# Patient Record
Sex: Female | Born: 2000 | Race: White | Hispanic: No | Marital: Single | Smoking: Current every day smoker
Health system: Southern US, Community
[De-identification: ages and names within clinical notes are randomized; demographics above are authoritative.]

## PROBLEM LIST (undated history)

## (undated) ENCOUNTER — Inpatient Hospital Stay (HOSPITAL_COMMUNITY): Payer: Self-pay

## (undated) DIAGNOSIS — R51 Headache: Secondary | ICD-10-CM

## (undated) DIAGNOSIS — K859 Acute pancreatitis without necrosis or infection, unspecified: Secondary | ICD-10-CM

## (undated) DIAGNOSIS — F419 Anxiety disorder, unspecified: Secondary | ICD-10-CM

## (undated) DIAGNOSIS — K8020 Calculus of gallbladder without cholecystitis without obstruction: Secondary | ICD-10-CM

## (undated) DIAGNOSIS — F909 Attention-deficit hyperactivity disorder, unspecified type: Secondary | ICD-10-CM

## (undated) DIAGNOSIS — K219 Gastro-esophageal reflux disease without esophagitis: Secondary | ICD-10-CM

## (undated) DIAGNOSIS — F509 Eating disorder, unspecified: Secondary | ICD-10-CM

## (undated) DIAGNOSIS — J45909 Unspecified asthma, uncomplicated: Secondary | ICD-10-CM

## (undated) HISTORY — DX: Acute pancreatitis without necrosis or infection, unspecified: K85.90

## (undated) HISTORY — DX: Calculus of gallbladder without cholecystitis without obstruction: K80.20

## (undated) HISTORY — PX: WISDOM TOOTH EXTRACTION: SHX21

---
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
## 2018-01-03 ENCOUNTER — Other Ambulatory Visit: Payer: Self-pay | Admitting: Family Medicine

## 2018-01-03 ENCOUNTER — Other Ambulatory Visit: Payer: Self-pay | Admitting: Obstetrics and Gynecology

## 2018-01-03 ENCOUNTER — Other Ambulatory Visit: Payer: Self-pay

## 2018-01-03 ENCOUNTER — Ambulatory Visit (INDEPENDENT_AMBULATORY_CARE_PROVIDER_SITE_OTHER): Payer: Medicaid Other | Admitting: Obstetrics and Gynecology

## 2018-01-03 ENCOUNTER — Telehealth (HOSPITAL_COMMUNITY): Payer: Self-pay | Admitting: *Deleted

## 2018-01-03 ENCOUNTER — Encounter: Payer: Self-pay | Admitting: Obstetrics and Gynecology

## 2018-01-03 VITALS — BP 121/72 | HR 93 | Wt 189.0 lb

## 2018-01-03 DIAGNOSIS — O480 Post-term pregnancy: Secondary | ICD-10-CM | POA: Diagnosis not present

## 2018-01-03 DIAGNOSIS — Z331 Pregnant state, incidental: Secondary | ICD-10-CM

## 2018-01-03 DIAGNOSIS — Z1389 Encounter for screening for other disorder: Secondary | ICD-10-CM

## 2018-01-03 DIAGNOSIS — Z3A41 41 weeks gestation of pregnancy: Secondary | ICD-10-CM

## 2018-01-03 DIAGNOSIS — Z3403 Encounter for supervision of normal first pregnancy, third trimester: Secondary | ICD-10-CM

## 2018-01-03 LAB — POCT URINALYSIS DIPSTICK OB
Blood, UA: NEGATIVE
GLUCOSE, UA: NEGATIVE — AB
Ketones, UA: NEGATIVE
NITRITE UA: NEGATIVE
POC,PROTEIN,UA: NEGATIVE

## 2018-01-03 NOTE — Progress Notes (Signed)
Patient ID: Beth ErpVictoria Lore, female   DOB: 2000/10/20, 17 y.o.   MRN: [id number]    LOW-RISK PREGNANCY VISIT Patient name: Beth Key MRN [id number]  Date of birth: 2000/10/20 Chief Complaint:   Routine Prenatal Visit (NST-postdates)  History of Present Illness:   Beth Key is a 17 y.o. G1P0 female at 2058w0d with an Estimated Date of Delivery: 12/27/17 being seen today for ongoing management of a low-risk pregnancy.This is Imogine's first pregnancy Started seeing Beth Key at 25 weeks due to switching to family tree late in pregnancy. She  was feeling mild contractions yesterday that made her lose her breath.  Today she reports contractions since yesterday. Contractions: Irregular. Vag. Bleeding: None.  Movement: Present. denies leaking of fluid. Review of Systems:   Pertinent items are noted in HPI Denies abnormal vaginal discharge w/ itching/odor/irritation, headaches, visual changes, shortness of breath, chest pain, abdominal pain, severe nausea/vomiting, or problems with urination or bowel movements unless otherwise stated above. Pertinent History Reviewed:  Reviewed past medical,surgical, social, obstetrical and family history.  Reviewed problem list, medications and allergies. Physical Assessment:   Vitals:   01/03/18 1015  BP: 121/72  Pulse: 93  Weight: 189 lb (85.7 kg)  Body mass index is 29.6 kg/m.        Physical Examination:   General appearance: Well appearing, and in no distress  Mental status: Alert, oriented to person, place, and time  Skin: Warm & dry  Cardiovascular: Normal heart rate noted  Respiratory: Normal respiratory effort, no distress  Abdomen: Soft, gravid, nontender  Pelvic: Cervical exam performed, way to the back,  Posterior soft minus 2        Extremities: Edema: None  Fetal Status:   Fundal Height: 39 cm Movement: Present    Results for orders placed or performed in visit on 01/03/18 (from the past 24 hour(s))  POC Urinalysis Dipstick OB   Collection Time: 01/03/18 10:15 AM  Result Value Ref Range   Color, UA     Clarity, UA     Glucose, UA Negative (A) (none)   Bilirubin, UA     Ketones, UA neg    Spec Grav, UA     Blood, UA neg    pH, UA     POC Protein UA Negative Negative, Trace   Urobilinogen, UA     Nitrite, UA neg    Leukocytes, UA Small (1+) (A) Negative   Appearance     Odor      Assessment & Plan:  1) Low-risk pregnancy G1P0 at 6458w0d with an Estimated Date of Delivery: 12/27/17   Meds: No orders of the defined types were placed in this encounter.  Labs/procedures today: NST REACTIVE  Plan:  1. Schedule induction Saturday 8/17 at 7 am 2. F/u 4 week Post-partum check  Reviewed: Term labor symptoms and general obstetric precautions including but not limited to vaginal bleeding, contractions, leaking of fluid and fetal movement were reviewed in detail with the patient.  All questions were answered  Follow-up: No follow-ups on file.  Orders Placed This Encounter  Procedures  . POC Urinalysis Dipstick OB    By signing my name below, I, Arnette NorrisMari Johnson, attest that this documentation has been prepared under the direction and in the presence of Tilda BurrowFerguson, Yue Flanigan V, MD Electronically Signed: Arnette NorrisMari Johnson Medical Scribe. 01/03/18. 10:35 AM.  I personally performed the services described in this documentation, which was SCRIBED in my presence. The recorded information has been reviewed and considered accurate. It has been edited  as necessary during review. Jonnie Kind, MD

## 2018-01-03 NOTE — Telephone Encounter (Signed)
Preadmission screen  

## 2018-01-04 ENCOUNTER — Encounter: Payer: Medicaid Other | Admitting: Advanced Practice Midwife

## 2018-01-06 ENCOUNTER — Other Ambulatory Visit: Payer: Self-pay

## 2018-01-06 ENCOUNTER — Inpatient Hospital Stay (HOSPITAL_COMMUNITY): Payer: Medicaid Other | Admitting: Anesthesiology

## 2018-01-06 ENCOUNTER — Inpatient Hospital Stay (HOSPITAL_COMMUNITY)
Admission: AD | Admit: 2018-01-06 | Discharge: 2018-01-08 | DRG: 806 | Disposition: A | Discharge status: Home / Self Care | Payer: Medicaid Other | Attending: Family Medicine | Admitting: Family Medicine

## 2018-01-06 ENCOUNTER — Encounter (HOSPITAL_COMMUNITY): Payer: Self-pay | Admitting: *Deleted

## 2018-01-06 ENCOUNTER — Inpatient Hospital Stay (HOSPITAL_COMMUNITY): Admission: RE | Admit: 2018-01-06 | Payer: Medicaid Other | Source: Ambulatory Visit

## 2018-01-06 DIAGNOSIS — O99320 Drug use complicating pregnancy, unspecified trimester: Secondary | ICD-10-CM | POA: Diagnosis present

## 2018-01-06 DIAGNOSIS — Z3403 Encounter for supervision of normal first pregnancy, third trimester: Secondary | ICD-10-CM

## 2018-01-06 DIAGNOSIS — O99324 Drug use complicating childbirth: Secondary | ICD-10-CM | POA: Diagnosis present

## 2018-01-06 DIAGNOSIS — Z880 Allergy status to penicillin: Secondary | ICD-10-CM | POA: Diagnosis not present

## 2018-01-06 DIAGNOSIS — O9962 Diseases of the digestive system complicating childbirth: Secondary | ICD-10-CM | POA: Diagnosis present

## 2018-01-06 DIAGNOSIS — O480 Post-term pregnancy: Secondary | ICD-10-CM | POA: Diagnosis present

## 2018-01-06 DIAGNOSIS — K219 Gastro-esophageal reflux disease without esophagitis: Secondary | ICD-10-CM | POA: Diagnosis present

## 2018-01-06 DIAGNOSIS — Z87891 Personal history of nicotine dependence: Secondary | ICD-10-CM

## 2018-01-06 DIAGNOSIS — Z3A41 41 weeks gestation of pregnancy: Secondary | ICD-10-CM | POA: Diagnosis not present

## 2018-01-06 DIAGNOSIS — Z3400 Encounter for supervision of normal first pregnancy, unspecified trimester: Secondary | ICD-10-CM

## 2018-01-06 DIAGNOSIS — Z8759 Personal history of other complications of pregnancy, childbirth and the puerperium: Secondary | ICD-10-CM

## 2018-01-06 LAB — CBC
HEMATOCRIT: 39.6 % (ref 36.0–46.0)
Hemoglobin: 13.3 g/dL (ref 12.0–15.0)
MCH: 29.1 pg (ref 26.0–34.0)
MCHC: 33.6 g/dL (ref 30.0–36.0)
MCV: 86.7 fL (ref 78.0–100.0)
Platelets: 308 10*3/uL (ref 150–400)
RBC: 4.57 MIL/uL (ref 3.87–5.11)
RDW: 14.5 % (ref 11.5–15.5)
WBC: 12.8 10*3/uL — ABNORMAL HIGH (ref 4.0–10.5)

## 2018-01-06 LAB — TYPE AND SCREEN
ABO/RH(D): A POS
Antibody Screen: NEGATIVE

## 2018-01-06 LAB — ABO/RH: ABO/RH(D): A POS

## 2018-01-06 LAB — RPR: RPR Ser Ql: NONREACTIVE

## 2018-01-06 MED ORDER — SIMETHICONE 80 MG PO CHEW
80.0000 mg | CHEWABLE_TABLET | ORAL | Status: DC | PRN
  Filled 2018-01-06: qty 1

## 2018-01-06 MED ORDER — DIPHENHYDRAMINE HCL 50 MG/ML IJ SOLN: 12.5000 mg | INTRAMUSCULAR | Status: DC | PRN

## 2018-01-06 MED ORDER — ACETAMINOPHEN 325 MG PO TABS
650.0000 mg | ORAL_TABLET | ORAL | Status: DC | PRN
  Administered 2018-01-07 (×2): 650 mg via ORAL
  Filled 2018-01-06 (×2): qty 2

## 2018-01-06 MED ORDER — FERROUS SULFATE 325 (65 FE) MG PO TABS
325.0000 mg | ORAL_TABLET | Freq: Two times a day (BID) | ORAL | Status: DC
  Administered 2018-01-07 – 2018-01-08 (×3): 325 mg via ORAL
  Filled 2018-01-06 (×3): qty 1

## 2018-01-06 MED ORDER — PHENYLEPHRINE 40 MCG/ML (10ML) SYRINGE FOR IV PUSH (FOR BLOOD PRESSURE SUPPORT)
80.0000 ug | PREFILLED_SYRINGE | INTRAVENOUS | Status: DC | PRN
  Administered 2018-01-06: 80 ug via INTRAVENOUS
  Filled 2018-01-06: qty 5

## 2018-01-06 MED ORDER — OXYCODONE-ACETAMINOPHEN 5-325 MG PO TABS: 1.0000 | ORAL_TABLET | ORAL | Status: DC | PRN

## 2018-01-06 MED ORDER — IBUPROFEN 600 MG PO TABS
600.0000 mg | ORAL_TABLET | Freq: Four times a day (QID) | ORAL | Status: DC
  Administered 2018-01-07 – 2018-01-08 (×7): 600 mg via ORAL
  Filled 2018-01-06 (×6): qty 1

## 2018-01-06 MED ORDER — ONDANSETRON HCL 4 MG PO TABS: 4.0000 mg | ORAL_TABLET | ORAL | Status: DC | PRN

## 2018-01-06 MED ORDER — TERBUTALINE SULFATE 1 MG/ML IJ SOLN
0.2500 mg | Freq: Once | INTRAMUSCULAR | Status: DC | PRN
  Filled 2018-01-06: qty 1

## 2018-01-06 MED ORDER — FAMOTIDINE 20 MG PO TABS
20.0000 mg | ORAL_TABLET | Freq: Once | ORAL | Status: AC
  Administered 2018-01-06: 20 mg via ORAL
  Filled 2018-01-06: qty 1

## 2018-01-06 MED ORDER — ACETAMINOPHEN 325 MG PO TABS: 650.0000 mg | ORAL_TABLET | ORAL | Status: DC | PRN

## 2018-01-06 MED ORDER — EPHEDRINE 5 MG/ML INJ
10.0000 mg | INTRAVENOUS | Status: DC | PRN
  Filled 2018-01-06: qty 2

## 2018-01-06 MED ORDER — ONDANSETRON HCL 4 MG/2ML IJ SOLN
4.0000 mg | Freq: Four times a day (QID) | INTRAMUSCULAR | Status: DC | PRN
  Administered 2018-01-06: 4 mg via INTRAVENOUS
  Filled 2018-01-06: qty 2

## 2018-01-06 MED ORDER — FLEET ENEMA 7-19 GM/118ML RE ENEM: 1.0000 | ENEMA | RECTAL | Status: DC | PRN

## 2018-01-06 MED ORDER — LIDOCAINE HCL (PF) 1 % IJ SOLN
INTRAMUSCULAR | Status: DC | PRN
  Administered 2018-01-06: 8 mL via EPIDURAL

## 2018-01-06 MED ORDER — COCONUT OIL OIL: 1.0000 "application " | TOPICAL_OIL | Status: DC | PRN

## 2018-01-06 MED ORDER — DIBUCAINE 1 % RE OINT: 1.0000 "application " | TOPICAL_OINTMENT | RECTAL | Status: DC | PRN

## 2018-01-06 MED ORDER — PRENATAL MULTIVITAMIN CH
1.0000 | ORAL_TABLET | Freq: Every day | ORAL | Status: DC
  Administered 2018-01-07 – 2018-01-08 (×2): 1 via ORAL
  Filled 2018-01-06 (×2): qty 1

## 2018-01-06 MED ORDER — WITCH HAZEL-GLYCERIN EX PADS
1.0000 "application " | MEDICATED_PAD | CUTANEOUS | Status: DC | PRN
  Administered 2018-01-08: 1 via TOPICAL

## 2018-01-06 MED ORDER — SOD CITRATE-CITRIC ACID 500-334 MG/5ML PO SOLN
30.0000 mL | ORAL | Status: DC | PRN
  Administered 2018-01-06: 30 mL via ORAL
  Filled 2018-01-06: qty 15

## 2018-01-06 MED ORDER — LACTATED RINGERS IV SOLN
500.0000 mL | INTRAVENOUS | Status: DC | PRN
  Administered 2018-01-06 (×2): 500 mL via INTRAVENOUS

## 2018-01-06 MED ORDER — BENZOCAINE-MENTHOL 20-0.5 % EX AERO
1.0000 "application " | INHALATION_SPRAY | CUTANEOUS | Status: DC | PRN
  Administered 2018-01-08: 1 via TOPICAL
  Filled 2018-01-06: qty 56

## 2018-01-06 MED ORDER — OXYTOCIN 40 UNITS IN LACTATED RINGERS INFUSION - SIMPLE MED
2.5000 [IU]/h | INTRAVENOUS | Status: DC
  Filled 2018-01-06: qty 1000

## 2018-01-06 MED ORDER — BUTORPHANOL TARTRATE 1 MG/ML IJ SOLN
2.0000 mg | Freq: Once | INTRAMUSCULAR | Status: AC
  Administered 2018-01-06: 2 mg via INTRAVENOUS
  Filled 2018-01-06: qty 2

## 2018-01-06 MED ORDER — ONDANSETRON HCL 4 MG/2ML IJ SOLN: 4.0000 mg | INTRAMUSCULAR | Status: DC | PRN

## 2018-01-06 MED ORDER — OXYTOCIN 40 UNITS IN LACTATED RINGERS INFUSION - SIMPLE MED
1.0000 m[IU]/min | INTRAVENOUS | Status: DC
  Administered 2018-01-06: 1 m[IU]/min via INTRAVENOUS

## 2018-01-06 MED ORDER — OXYTOCIN BOLUS FROM INFUSION
500.0000 mL | Freq: Once | INTRAVENOUS | Status: AC
  Administered 2018-01-06: 500 mL via INTRAVENOUS

## 2018-01-06 MED ORDER — FENTANYL 2.5 MCG/ML BUPIVACAINE 1/10 % EPIDURAL INFUSION (WH - ANES)
14.0000 mL/h | INTRAMUSCULAR | Status: DC | PRN
  Administered 2018-01-06 (×2): 14 mL/h via EPIDURAL
  Filled 2018-01-06 (×2): qty 100

## 2018-01-06 MED ORDER — LACTATED RINGERS IV SOLN
INTRAVENOUS | Status: DC
  Administered 2018-01-06 (×4): via INTRAVENOUS

## 2018-01-06 MED ORDER — PHENYLEPHRINE 40 MCG/ML (10ML) SYRINGE FOR IV PUSH (FOR BLOOD PRESSURE SUPPORT)
80.0000 ug | PREFILLED_SYRINGE | INTRAVENOUS | Status: DC | PRN
  Filled 2018-01-06: qty 10
  Filled 2018-01-06: qty 5

## 2018-01-06 MED ORDER — MEASLES, MUMPS & RUBELLA VAC ~~LOC~~ INJ
0.5000 mL | INJECTION | Freq: Once | SUBCUTANEOUS | Status: DC
  Filled 2018-01-06: qty 0.5

## 2018-01-06 MED ORDER — MAGNESIUM HYDROXIDE 400 MG/5ML PO SUSP: 30.0000 mL | ORAL | Status: DC | PRN

## 2018-01-06 MED ORDER — LACTATED RINGERS IV SOLN: 500.0000 mL | Freq: Once | INTRAVENOUS | Status: DC

## 2018-01-06 MED ORDER — DIPHENHYDRAMINE HCL 25 MG PO CAPS: 25.0000 mg | ORAL_CAPSULE | Freq: Four times a day (QID) | ORAL | Status: DC | PRN

## 2018-01-06 MED ORDER — OXYCODONE-ACETAMINOPHEN 5-325 MG PO TABS: 2.0000 | ORAL_TABLET | ORAL | Status: DC | PRN

## 2018-01-06 MED ORDER — TETANUS-DIPHTH-ACELL PERTUSSIS 5-2.5-18.5 LF-MCG/0.5 IM SUSP: 0.5000 mL | Freq: Once | INTRAMUSCULAR | Status: DC

## 2018-01-06 MED ORDER — LIDOCAINE HCL (PF) 1 % IJ SOLN
30.0000 mL | INTRAMUSCULAR | Status: DC | PRN
  Filled 2018-01-06: qty 30

## 2018-01-06 NOTE — Anesthesia Procedure Notes (Signed)
Epidural Patient location during procedure: OB Start time: 01/06/2018 5:15 AM End time: 01/06/2018 5:20 AM  Staffing Anesthesiologist: Bethena Midgetddono, Farrell Pantaleo, MD  Preanesthetic Checklist Completed: patient identified, site marked, surgical consent, pre-op evaluation, timeout performed, IV checked, risks and benefits discussed and monitors and equipment checked  Epidural Patient position: sitting Prep: site prepped and draped and DuraPrep Patient monitoring: continuous pulse ox and blood pressure Approach: midline Location: L4-L5 Injection technique: LOR air  Needle:  Needle type: Tuohy  Needle gauge: 17 G Needle length: 9 cm and 9 Needle insertion depth: 5 cm cm Catheter type: closed end flexible Catheter size: 19 Gauge Catheter at skin depth: 10 cm Test dose: negative  Assessment Events: blood not aspirated, injection not painful, no injection resistance, negative IV test and no paresthesia

## 2018-01-06 NOTE — Anesthesia Preprocedure Evaluation (Signed)
Anesthesia Evaluation  Patient identified by MRN, date of birth, ID band Patient awake    Reviewed: Allergy & Precautions, H&P , NPO status , Patient's Chart, lab work & pertinent test results, reviewed documented beta blocker date and time   Airway Mallampati: II  TM Distance: >3 FB Neck ROM: full    Dental no notable dental hx.    Pulmonary neg pulmonary ROS, former smoker,    Pulmonary exam normal breath sounds clear to auscultation       Cardiovascular Exercise Tolerance: Good negative cardio ROS Normal cardiovascular exam Rhythm:regular Rate:Normal     Neuro/Psych negative neurological ROS  negative psych ROS   GI/Hepatic negative GI ROS, Neg liver ROS, GERD  ,  Endo/Other  negative endocrine ROS  Renal/GU negative Renal ROS  negative genitourinary   Musculoskeletal   Abdominal   Peds  Hematology negative hematology ROS (+)   Anesthesia Other Findings   Reproductive/Obstetrics negative OB ROS (+) Pregnancy                             Anesthesia Physical Anesthesia Plan  ASA: II  Anesthesia Plan: Epidural   Post-op Pain Management:    Induction:   PONV Risk Score and Plan:   Airway Management Planned:   Additional Equipment:   Intra-op Plan:   Post-operative Plan:   Informed Consent: I have reviewed the patients History and Physical, chart, labs and discussed the procedure including the risks, benefits and alternatives for the proposed anesthesia with the patient or authorized representative who has indicated his/her understanding and acceptance.   Dental Advisory Given  Plan Discussed with: CRNA and Anesthesiologist  Anesthesia Plan Comments:         Anesthesia Quick Evaluation

## 2018-01-06 NOTE — H&P (Addendum)
LABOR AND DELIVERY ADMISSION HISTORY AND PHYSICAL NOTE  Beth Key is a 17 y.o. female G1P0 with IUP at [redacted]w[redacted]d by u/s presenting for contractions. She reports positive fetal movement. Endorses contractions that began around midnight today and are occurring every 2-4 minutes. She denies leakage of fluid or vaginal bleeding. She is having a boy, plans on bottle feeding, and is undecided on contraception postpartum, but would like "whatever the best one is." She plans for outpatient circumcision.  Prenatal History/Complications: PNC at [facility] Pregnancy complications:  - Eating disorder  Past Medical History: Past Medical History:  Diagnosis Date  . Acute pancreatitis 10/08/2013  . ADHD (attention deficit hyperactivity disorder)   . Anxiety   . Asthma    Last asthmatic episode approx. 1 yr ago  . Eating disorder    Pt states that she is anorexic and bulemic; MDs made aware of pt. self-diagnosis  . Gallstones 10/15/2013  . Headache(784.0)   . Symptomatic cholelithiasis 12/31/2013    Past Surgical History: Past Surgical History:  Procedure Laterality Date  . CHOLECYSTECTOMY N/A 12/31/2013   Procedure: LAPAROSCOPIC CHOLECYSTECTOMY ;  Surgeon: Judie Petit. Leonia Corona, MD;  Location: [facility] OR;  Service: Pediatrics;  Laterality: N/A;    Obstetrical History: OB History    Gravida  1   Para      Term      Preterm      AB      Living        SAB      TAB      Ectopic      Multiple      Live Births              Social History: Social History   Socioeconomic History  . Marital status: Single    Spouse name: Not on file  . Number of children: Not on file  . Years of education: Not on file  . Highest education level: Not on file  Occupational History  . Not on file  Social Needs  . Financial resource strain: Not on file  . Food insecurity:    Worry: Not on file    Inability: Not on file  . Transportation needs:    Medical: Not on file    Non-medical: Not on  file  Tobacco Use  . Smoking status: Former Smoker    Last attempt to quit: 07/2017    Years since quitting: 0.4  . Smokeless tobacco: Never Used  Substance and Sexual Activity  . Alcohol use: Not Currently    Alcohol/week: 0.0 standard drinks  . Drug use: Not Currently    Types: Marijuana, Cocaine    Comment: before pregnancy  . Sexual activity: Yes    Birth control/protection: None  Lifestyle  . Physical activity:    Days per week: Not on file    Minutes per session: Not on file  . Stress: Not on file  Relationships  . Social connections:    Talks on phone: Not on file    Gets together: Not on file    Attends religious service: Not on file    Active member of club or organization: Not on file    Attends meetings of clubs or organizations: Not on file    Relationship status: Not on file  Other Topics Concern  . Not on file  Social History Narrative   Pt. Lives at home with her adopted mother and father. There is no smokers in the home. There are 2  cats and 2 dogs in the home. Adopted mother is patient's biological great-aunt. Patient was adopted at 8mths of age.      Confidentiality was discussed with the patient and if applicable, with caregiver as well.      Tobacco?  yes, quit 2 weeks ago as of 07/15/2015   Drugs/ETOH?  yes, quit marijuana use 2 weeks ago as of 07/15/2015   Partner preference?  female Sexually Active?  yes    Pregnancy Prevention:  implant, reviewed condoms & plan B   Safe at home, in school & in relationships?  yes   Safe to self?   yes        Family History: Family History  Adopted: Yes  Problem Relation Age of Onset  . Alcohol abuse Mother   . Depression Mother   . Drug abuse Mother   . Heart disease Maternal Grandmother   . Hypertension Maternal Grandmother   . Depression Maternal Grandmother   . Heart disease Maternal Aunt   . Hypertension Maternal Aunt   . Depression Maternal Aunt     Allergies: Allergies  Allergen Reactions  .  Penicillins Rash  . Promethazine Other (See Comments)    Dystonic reaction to IV Promethazine (jerky movements)    Medications Prior to Admission  Medication Sig Dispense Refill Last Dose  . Prenatal Vit-Fe Fumarate-FA (MULTIVITAMIN-PRENATAL) 27-0.8 MG TABS tablet Take 1 tablet by mouth daily at 12 noon.   01/05/2018 at Unknown time  . ranitidine (ZANTAC) 150 MG tablet Take 1 tablet (150 mg total) by mouth 2 (two) times daily. 60 tablet 3 01/05/2018 at Unknown time  . calcium carbonate (TUMS - DOSED IN MG ELEMENTAL CALCIUM) 500 MG chewable tablet Chew 1 tablet by mouth 2 (two) times daily as needed for indigestion or heartburn.    Taking     Review of Systems  All systems reviewed and negative except as stated in HPI  Physical Exam Blood pressure 113/71, pulse (!) 103, temperature 98.1 F (36.7 C), temperature source Oral, resp. rate 16, height 5\' 7"  (1.702 m), weight 84.4 kg, last menstrual period 03/04/2017. General appearance: alert, oriented, uncomfortable but not in acute distress Lungs: normal respiratory effort Heart: regular rate Abdomen: soft, non-tender; gravid, FH appropriate for GA Extremities: No calf swelling or tenderness Presentation: cephalic Fetal monitoring: baseline rate 135bpm, moderate variability, +acels, no decels Uterine activity: Q2 min Dilation: 2 Effacement (%): 100 Station: -2 Exam by:: Quintella BatonJo Barham rNc   Prenatal labs: ABO, Rh: --/--/A POS (08/17 0304) Antibody: NEG (08/17 0304) Rubella: Immune (01/10 0000) RPR: Non Reactive (07/15 1254)  HBsAg: Negative (01/10 0000)  HIV: Non Reactive (07/15 1254)  GC/Chlamydia: Negative (07/15) GBS:   Negative (07/15) 2-hr GTT: None (CBG 79 on 05/02) Genetic screening: Negative Anatomy US: Normal (EFW 3840g)  Prenatal Transfer Tool  Maternal Diabetes: No Genetic Screening: Normal Maternal Ultrasounds/Referrals: Normal Fetal Ultrasounds or other Referrals:  None Maternal Substance Abuse:  No Significant  Maternal Medications:  None Significant Maternal Lab Results: Lab values include: Group B Strep negative  Results for orders placed or performed during the hospital encounter of 01/06/18 (from the past 24 hour(s))  CBC   Collection Time: 01/06/18  3:04 AM  Result Value Ref Range   WBC 12.8 (H) 4.0 - 10.5 K/uL   RBC 4.57 3.87 - 5.11 MIL/uL   Hemoglobin 13.3 12.0 - 15.0 g/dL   HCT 78.239.6 95.636.0 - 21.346.0 %   MCV 86.7 78.0 - 100.0 fL   MCH 29.1  26.0 - 34.0 pg   MCHC 33.6 30.0 - 36.0 g/dL   RDW 14.5 09.611.5 - 04.515.5 %   Platelets 308 150 - 400 K/uL  Type and screen   Collection Time: 01/06/18  3:04 AM  Result Value Ref Range   ABO/RH(D) A16.1 POS    Antibody Screen NEG    Sample Expiration      01/09/2018 Performed at [facility], [address] [place], [place] [address]     Patient Active Problem List   Diagnosis Date Noted  . Personal history of previous postdates pregnancy 01/06/2018  . Supervision of normal first pregnancy 10/11/2017  . Drug use affecting pregnancy 06/02/2017  . Pregnancy 06/01/2017  . Gastroesophageal reflux disease without esophagitis 10/30/2015  . Panic disorder 07/15/2015  . Substance abuse (HCC) 09/18/2014  . Nicotine abuse 10/15/2013  . 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 Disordered eating 10/15/2013  . Menorrhagia with irregular cycle 10/08/2013    Assessment: Beth Key is a 17 y.o. G1P0 at 4854w3d here for SOL. She began feeling contractions regularly around midnight today. She was originally scheduled for induction today at 0700 for postdates.  #Labor: expectant #Pain: epidural  #FWB: Cat I #ID: GBS negative #MOF: Bottle #MOC: Undecided (wants best available option but needs discussion when not under influence of pain meds) #Circ: Yes (outpatient)  Natasha MeadBryan A Chadwick, MS3 01/06/2018, 4:22 AM   CNM attestation:  I have seen and examined this patient; I agree with above documentation in the med student's note.    Beth Key is a 17 y.o. G1P0 here for latent labor prior to postdates IOL later in the morning  PE: BP 100/60   Pulse 95   Temp 98.8 F (37.1 C) (Oral)   Resp 18   Ht 5\' 7"  (1.702 m)   Wt 84.4 kg   LMP 03/04/2017   SpO2 99%   BMI 29.13 kg/m   Resp: normal effort, no distress Abd: gravid  ROS, labs, PMH reviewed  [facility]: [facility] May need to augment Anticipate SVD  Cam HaiSHAW, Miriah Maruyama CNM 01/06/2018, 10:29 AM

## 2018-01-06 NOTE — Anesthesia Pain Management Evaluation Note (Signed)
  CRNA Pain Management Visit Note  Patient: Beth Key, 17 y.o., female  "Hello I am a member of the anesthesia team at [facility]. We have an anesthesia team available at all times to provide care throughout the hospital, including epidural management and anesthesia for C-section. I don't know your plan for the delivery whether it a natural birth, water birth, IV sedation, nitrous supplementation, doula or epidural, but we want to meet your pain goals."   1.Was your pain managed to your expectations on prior hospitalizations?   No prior hospitalizations  2.What is your expectation for pain management during this hospitalization?     Epidural  3.How can we help you reach that goal?   Record the patient's initial score and the patient's pain goal.   Pain: 0  Pain Goal: 5 [facility] wants you to be able to say your pain was always managed very well.  Laban EmperorMalinova,Beth Key 01/06/2018

## 2018-01-06 NOTE — Progress Notes (Signed)
Beth Key is a 17 y.o. G1P0 at 7445w3d admitted for induction of labor due to Post dates. Due date 12/27/17.  Subjective: Epidural in place, patient very sleepy. No complaints or concerns.  Objective: BP 109/67   Pulse 97   Temp 99.3 F (37.4 C) (Oral)   Resp 18   Ht 5\' 7"  (1.702 m)   Wt 84.4 kg   LMP 03/04/2017   SpO2 99%   BMI 29.13 kg/m  I/O last 3 completed shifts: In: 765 [I.V.:765] Out: -  Total I/O In: -  Out: 600 [Urine:600]  FHT:  FHR: 140 bpm, variability: moderate,  accelerations:  Present,  decelerations:  Absent UC:   irregular, every 2-5 minutes, coupling noted SVE:   Dilation: Anterior Lip Effacement (%): 100% (anterior portion has thickened) Station: 0, +1 Exam by:: Beth Key, CNM Labs: Lab Results  Component Value Date   WBC 12.8 (H) 01/06/2018   HGB 13.3 01/06/2018   HCT 39.6 01/06/2018   MCV 86.7 01/06/2018   PLT 308 01/06/2018    Assessment / Plan:  Induction of labor due to postterm,  progressing well on pitocin  Continue to reduce anterior lip with exaggerated Simms, peanut ball Fetal Wellbeing:  Category I Pain Control:  Epidural I/D:  n/a Anticipated MOD:  NSVD  Beth Key, CNM 01/06/2018, 5:53 PM

## 2018-01-06 NOTE — Progress Notes (Signed)
Beth ErpVictoria Key is a 17 y.o. G1P0 at 6042w3d admitted for latent labor prior to IOL for postdates.  Subjective: S/p epidural, denies pain, supportive family members at bedside  Objective: BP (!) 104/56   Pulse 91   Temp 98.8 F (37.1 C) (Oral)   Resp 18   Ht 5\' 7"  (1.702 m)   Wt 84.4 kg   LMP 03/04/2017   SpO2 99%   BMI 29.13 kg/m  I/O last 3 completed shifts: In: 765 [I.V.:765] Out: -  No intake/output data recorded.  FHT:  FHR: 130s bpm, variability: moderate,  accelerations:  Present,  decelerations:  Absent UC:   irregular, every 2-6 minutes SVE:   Dilation: 3.5 Effacement (%): 100 Station: -2 Exam by:: Owens-IllinoisKristen Faucett RNC  Labs: Lab Results  Component Value Date   WBC 12.8 (H) 01/06/2018   HGB 13.3 01/06/2018   HCT 39.6 01/06/2018   MCV 86.7 01/06/2018   PLT 308 01/06/2018    Assessment / Plan: Protracted latent phase, initiate augmentation with Pitocin now Preeclampsia:  N/A Fetal Wellbeing:  Category I Pain Control:  Epidural I/D:  n/a Anticipated MOD:  NSVD  Calvert CantorSamantha C Sheriece Jefcoat, CNM 01/06/2018, 9:35 AM

## 2018-01-06 NOTE — Progress Notes (Signed)
Philipp DeputyKim SHaw CNM notified of pt's admission and status. Aware of ctx pattern, sve, elevated b/ps, for IOL this am. [facility] and watch b/ps for now.

## 2018-01-06 NOTE — MAU Note (Signed)
Ctxs since MN. Denies LOF or bleeding. 1cm last sve. For IOL at 0700

## 2018-01-06 NOTE — Progress Notes (Signed)
Beth Key is a 17 y.o. G1P0 at 2733w3d admitted for latent labor prior to scheduled IOL for postdates  Subjective: Epidural in place, denies pain, concerns, complaints at this time. Very relaxed and laughing with friends/family at bedside.  Objective: BP 92/62   Pulse 86   Temp 99 F (37.2 C) (Oral)   Resp 18   Ht 5\' 7"  (1.702 m)   Wt 84.4 kg   LMP 03/04/2017   SpO2 99%   BMI 29.13 kg/m  I/O last 3 completed shifts: In: 765 [I.V.:765] Out: -  Total I/O In: -  Out: 600 [Urine:600]  FHT:  FHR: 140 bpm, variability: moderate,  accelerations:  Present,  decelerations:  Absent UC:   irregular, every 2-4 minutes SVE:   Dilation: 6 Effacement (%): 100 Station: -1 Exam by:: Beth NakaiKristin McLeod RN  Labs: Lab Results  Component Value Date   WBC 12.8 (H) 01/06/2018   HGB 13.3 01/06/2018   HCT 39.6 01/06/2018   MCV 86.7 01/06/2018   PLT 308 01/06/2018    Assessment / Plan: Induction of labor due to postterm,  progressing well on pitocin  Labor: Progressing on Pitocin, will continue to increase then AROM Preeclampsia:  N/A Fetal Wellbeing:  Category I Pain Control:  Epidural I/D:  n/a Anticipated MOD:  NSVD   Calvert CantorSamantha C Eriberto Key, CNM 01/06/2018, 1:25 PM

## 2018-01-06 NOTE — Progress Notes (Signed)
To BS via w/c °

## 2018-01-07 NOTE — Clinical Social Work Maternal (Signed)
CLINICAL SOCIAL WORK MATERNAL/CHILD NOTE  Patient Details  Name: Beth Key MRN: [id number] Date of Birth: April 03, 2001  Date:  01/07/2018  Clinical Social Worker Initiating Note:  Madilyn Fireman, MSW, LCSW-A Date/Time: Initiated:  01/07/18/1125     Child's Name:  Beth Key   Biological Parents:  Mother, Father   Need for Interpreter:  None   Reason for Referral:  Current Substance Use/Substance Use During Pregnancy    Address:  [place] [address]    Phone number:  [phone] (home)     Additional phone number:   Household Members/Support Persons (HM/SP):   Household Member/Support Person 1   HM/SP Name Relationship DOB or Age  HM/SP -Beth Key FOB, spouse    HM/SP -2        HM/SP -3        HM/SP -4        HM/SP -5        HM/SP -6        HM/SP -7        HM/SP -8          Natural Supports (not living in the home):  Extended Family, Friends, Immediate Family   Professional Supports: None   Employment: Unemployed, Student(Patient to begin Human resources officer courses at [employer] in two weeks)   Type of Work:     Education:  High school graduate   Homebound arranged:    Museum/gallery curator Resources:  Medicaid   Other Resources:  [facility]   Cultural/Religious Considerations Which May Impact Care:  Christian   Strengths:  Ability to meet basic needs , Home prepared for child , Pediatrician chosen   Psychotropic Medications:         Pediatrician:    [place] (including [place])  Pediatrician List:   [facility](Beth Key)    Pediatrician Fax Number:    Risk Factors/Current Problems:      Cognitive State:  Able to Concentrate , Alert    Mood/Affect:  Calm , Comfortable , Happy , Interested    CSW Assessment: CSW received consult for [name] due to history of anxiety and marijuana use. CSW  met with [name] and baby Beth Key at bedside to complete assessment. [name] reports that her anxiety diagnosis was when she was a teenager with an eating disorder. [name] reports her diagnosis is no longer relevant or an issue for her. [name] and CSW discussed marijuana use during pregnancy, [name] reports she has used marijuana since the age of 72. [name] reports her last use of marijuana was in June 2019. [name] reports she is currently on probation for a misdemeanor larceny charge. [name] was educated on hospital drug screening policies, [name] did not have questions or concerns. [name] reports that she will begin classes at [facility] in two weeks for Dental Hygiene. [name] reports having a new car seat for Beth Key with knowledge of installation and use. [name] reports Beth Key will sleep in a crib at home. [name] and CSW thoroughly reviewed SIDS reduction techniques. [name] reports Beth Key will receive pediatric care at [facility] with Beth Key. [name] stated she has a family member who is an Therapist, sports at that practice. [name] reports her FOB is Beth Key. [name] reports both families are supportive and available if needed. [name] receives [employer], Medicaid, and [employer]. [name] has transportation. [name] did  not have any questions or concerns at this time. CSW encouraged [name] to reach out of if needs arise.  CSW will continue to monitor chart for UDS and cord results and will make report if warranted.  CSW Plan/Description:  CSW Will Continue to Monitor Umbilical Cord Tissue Drug Screen Results and Make Report if Warranted, Goulds, Beth Key 01/07/2018, 11:27 AM

## 2018-01-07 NOTE — Progress Notes (Addendum)
POSTPARTUM PROGRESS NOTE  Post Partum Day 1 Subjective:  Beth Key is a 17 y.o. G1P1001 6834w3d s/p SVD w/ 2nd degree lac.  No acute events overnight.  Pt denies problems with voiding or po intake, not OOB yet.  She denies nausea or vomiting.  Pain is well controlled.  She has had flatus. She has not had bowel movement.  Lochia Small.   Objective: Blood pressure 106/65, pulse 73, temperature 98.3 F (36.8 C), temperature source Oral, resp. rate 18, height 5\' 7"  (1.702 m), weight 84.4 kg, last menstrual period 03/04/2017, SpO2 100 %, unknown if currently breastfeeding.  Physical Exam:  General: alert, cooperative and no distress Lochia:normal flow Chest: CTAB Heart: RRR no m/r/g Abdomen: +BS, soft, nontender,  Uterine Fundus: firm DVT Evaluation: No calf swelling or tenderness Extremities: no edema  Recent Labs    01/06/18 0304  HGB 13.3  HCT 39.6    Assessment/Plan:  ASSESSMENT: Beth ErpVictoria Hem is a 17 y.o. G1P1001 6334w3d s/p SVD w/ 2nd degree lac.HSD stable.   #WBC to 12.8(8/18) from 7.5. Afebrile, no s/s or infection, (fundus firm, dysuria likely 2/2 to lac). Will continue monitor for signs of infxn.  Plan for discharge tomorrow and Contraception nexplanon at f/u visit Bottle feeding.   LOS: 1 day   Beth MoPeter Gianne Shugars, MD 01/07/2018, 9:54 AM

## 2018-01-07 NOTE — Anesthesia Postprocedure Evaluation (Signed)
Anesthesia Post Note  Patient: Beth Key  Procedure(s) Performed: AN AD HOC LABOR EPIDURAL     Patient location during evaluation: Mother Baby Anesthesia Type: Epidural Level of consciousness: awake and alert Pain management: pain level controlled Vital Signs Assessment: post-procedure vital signs reviewed and stable Respiratory status: spontaneous breathing, nonlabored ventilation and respiratory function stable Cardiovascular status: stable Postop Assessment: no headache, no backache, epidural receding, able to ambulate, adequate PO intake, no apparent nausea or vomiting and patient able to bend at knees Anesthetic complications: no    Last Vitals:  Vitals:   01/07/18 0014 01/07/18 0420  BP: 118/80 117/71  Pulse: (!) 108 80  Resp: 18 18  Temp: 36.9 C 37.1 C  SpO2:      Last Pain:  Vitals:   01/07/18 0420  TempSrc: Oral  PainSc: 0-No pain   Pain Goal: Patients Stated Pain Goal: 0 (01/06/18 0218)               Laban EmperorMalinova,Sigfredo Schreier Hristova

## 2018-01-07 NOTE — Progress Notes (Signed)
Post Partum Day 1 Subjective: no complaints, up ad lib, voiding, tolerating PO and + flatus  Objective: Blood pressure 106/65, pulse 73, temperature 98.3 F (36.8 C), temperature source Oral, resp. rate 18, height 5\' 7"  (1.702 m), weight 84.4 kg, last menstrual period 03/04/2017, SpO2 100 %, unknown if currently breastfeeding.  Physical Exam:  General: alert, cooperative, appears stated age and no distress Lochia: appropriate Uterine Fundus: firm Incision: n/a DVT Evaluation: No evidence of DVT seen on physical exam.  Recent Labs    01/06/18 0304  HGB 13.3  HCT 39.6    Assessment/Plan: Plan for discharge tomorrow   LOS: 1 day   Beth Key 01/07/2018, 10:27 AM

## 2018-01-08 DIAGNOSIS — O480 Post-term pregnancy: Secondary | ICD-10-CM

## 2018-01-08 DIAGNOSIS — Z3A41 41 weeks gestation of pregnancy: Secondary | ICD-10-CM

## 2018-01-08 MED ORDER — CALCIUM CARBONATE ANTACID 500 MG PO CHEW
1.0000 | CHEWABLE_TABLET | Freq: Three times a day (TID) | ORAL | Status: DC | PRN
  Administered 2018-01-08: 200 mg via ORAL
  Filled 2018-01-08: qty 1

## 2018-01-08 NOTE — Discharge Summary (Signed)
OB Discharge Summary     Patient Name: Beth Key DOB: Apr 06, 2001 MRN: [id number]  Date of admission: 01/06/2018 Delivering MD: Clayton BiblesWEINHOLD, SAMANTHA C   Date of discharge: 01/08/2018  Admitting diagnosis: 41 wks ctx 2 to 3 mins Intrauterine pregnancy: 9680w3d     Secondary diagnosis:  Active Problems:   Supervision of normal first pregnancy   Drug use affecting pregnancy   Personal history of previous postdates pregnancy  Additional problems: none     Discharge diagnosis: Term Pregnancy Delivered                                                                                                Post partum procedures:none  Augmentation: Pitocin  Complications: None  Hospital course:  Induction of Labor With Vaginal Delivery   17 y.o. yo G1P1001 at 2980w3d was admitted to the hospital 01/06/2018 for induction of labor.  Indication for induction: Postdates.  Patient had an uncomplicated labor course as follows: Membrane Rupture Time/Date: 2:44 PM ,01/06/2018   Intrapartum Procedures: Episiotomy: None [1]                                         Lacerations:  2nd degree [3];Perineal [11]  Patient had delivery of a Viable infant.  Information for the patient's newborn:  Lavone NeriWagner, Boy Valarie [[id number]][[id number]]  Delivery Method: Vaginal, Spontaneous(Filed from Delivery Summary)   01/06/2018  Details of delivery can be found in separate delivery note.  Patient had a routine postpartum course. Patient is discharged home 01/08/18.  Physical exam  Vitals:   01/07/18 0800 01/07/18 1220 01/07/18 2327 01/08/18 0601  BP: 106/65 114/65 116/72 102/60  Pulse: 73 77 85 73  Resp: 18 20 19 18   Temp: 98.3 F (36.8 C) 98.2 F (36.8 C) 98.2 F (36.8 C) 98.1 F (36.7 C)  TempSrc: Oral Axillary Oral Oral  SpO2:   99%   Weight:      Height:       General: alert, cooperative and no distress Lochia: appropriate Uterine Fundus: firm Incision: N/A DVT Evaluation: No evidence of DVT seen on physical  exam. Labs: Lab Results  Component Value Date   WBC 12.8 (H) 01/06/2018   HGB 13.3 01/06/2018   HCT 39.6 01/06/2018   MCV 86.7 01/06/2018   PLT 308 01/06/2018   CMP Latest Ref Rng & Units 12/04/2017  Glucose 65 - 99 mg/dL 92  BUN 6 - 20 mg/dL 8  Creatinine 8.650.57 - 7.841.00 mg/dL 6.96(E0.56(L)  Sodium 952134 - 841144 mmol/L 141  Potassium 3.5 - 5.2 mmol/L 4.2  Chloride 96 - 106 mmol/L 105  CO2 20 - 29 mmol/L 22  Calcium 8.7 - 10.2 mg/dL 3.2(G8.6(L)  Total Protein 6.0 - 8.5 g/dL 4.0(N5.8(L)  Total Bilirubin 0.0 - 1.2 mg/dL 0.3  Alkaline Phos 39 - 117 IU/L 125(H)  AST 0 - 40 IU/L 17  ALT 0 - 32 IU/L 16    Discharge instruction: per After Visit Summary and "Baby and Me Booklet".  After  visit meds:  Allergies as of 01/08/2018      Reactions   Penicillins Rash   Has patient had a PCN reaction causing immediate rash, facial/tongue/throat swelling, SOB or lightheadedness with hypotension: Yes Has patient had a PCN reaction causing severe rash involving mucus membranes or skin necrosis: No Has patient had a PCN reaction that required hospitalization: No Has patient had a PCN reaction occurring within the last 10 years: No If all of the above answers are "NO", then may proceed with Cephalosporin use.   Promethazine Other (See Comments)   Dystonic reaction to IV Promethazine (jerky movements)      Medication List    TAKE these medications   calcium carbonate 500 MG chewable tablet Commonly known as:  TUMS - dosed in mg elemental calcium Chew 1 tablet by mouth 2 (two) times daily as needed for indigestion or heartburn.   multivitamin-prenatal 27-0.8 MG Tabs tablet Take 1 tablet by mouth daily at 12 noon.   ranitidine 150 MG tablet Commonly known as:  ZANTAC Take 1 tablet (150 mg total) by mouth 2 (two) times daily.       Diet: Pitocin  Activity: Advance as tolerated. Pelvic rest for 6 weeks.   Outpatient follow up:4 weeks Follow up Appt: Future Appointments  Date Time Provider Department  Center  02/12/2018  1:30 PM Cheral MarkerBooker, Kimberly R, CNM [facility]   Follow up Visit:No follow-ups on file.  Postpartum contraception: Nexplanon  Newborn Data: Live born female  Birth Weight: 9 lb 3.3 oz (4176 g) APGAR: 9, 9  Newborn Delivery   Birth date/time:  01/06/2018 19:57:00 Delivery type:  Vaginal, Spontaneous     Baby Feeding: Bottle Disposition:home with mother   01/08/2018 De Hollingsheadatherine L Loucile Posner, DO   OB FELLOW DISCHARGE ATTESTATION  I have seen and examined this patient and agree with above documentation in the resident's note.   Patient declined inpatient placement of Nexplanon. [facility]   Marcy Sirenatherine Adaleen Hulgan, D.O. OB Fellow  01/08/2018, 11:56 AM

## 2018-01-08 NOTE — Discharge Instructions (Signed)
Vaginal Delivery, Care After °Refer to this sheet in the next few weeks. These instructions provide you with information about caring for yourself after vaginal delivery. Your health care provider may also give you more specific instructions. Your treatment has been planned according to current medical practices, but problems sometimes occur. Call your health care provider if you have any problems or questions. °What can I expect after the procedure? °After vaginal delivery, it is common to have: °· Some bleeding from your vagina. °· Soreness in your abdomen, your vagina, and the area of skin between your vaginal opening and your anus (perineum). °· Pelvic cramps. °· Fatigue. ° °Follow these instructions at home: °Medicines °· Take over-the-counter and prescription medicines only as told by your health care provider. °· If you were prescribed an antibiotic medicine, take it as told by your health care provider. Do not stop taking the antibiotic until it is finished. °Driving ° °· Do not drive or operate heavy machinery while taking prescription pain medicine. °· Do not drive for 24 hours if you received a sedative. °Lifestyle °· Do not drink alcohol. This is especially important if you are breastfeeding or taking medicine to relieve pain. °· Do not use tobacco products, including cigarettes, chewing tobacco, or e-cigarettes. If you need help quitting, ask your health care provider. °Eating and drinking °· Drink at least 8 eight-ounce glasses of water every day unless you are told not to by your health care provider. If you choose to breastfeed your baby, you may need to drink more water than this. °· Eat high-fiber foods every day. These foods may help prevent or relieve constipation. High-fiber foods include: °? Whole grain cereals and breads. °? Brown rice. °? Beans. °? Fresh fruits and vegetables. °Activity °· Return to your normal activities as told by your health care provider. Ask your health care provider  what activities are safe for you. °· Rest as much as possible. Try to rest or take a nap when your baby is sleeping. °· Do not lift anything that is heavier than your baby or 10 lb (4.5 kg) until your health care provider says that it is safe. °· Talk with your health care provider about when you can engage in sexual activity. This may depend on your: °? Risk of infection. °? Rate of healing. °? Comfort and desire to engage in sexual activity. °Vaginal Care °· If you have an episiotomy or a vaginal tear, check the area every day for signs of infection. Check for: °? More redness, swelling, or pain. °? More fluid or blood. °? Warmth. °? Pus or a bad smell. °· Do not use tampons or douches until your health care provider says this is safe. °· Watch for any blood clots that may pass from your vagina. These may look like clumps of dark red, brown, or black discharge. °General instructions °· Keep your perineum clean and dry as told by your health care provider. °· Wear loose, comfortable clothing. °· Wipe from front to back when you use the toilet. °· Ask your health care provider if you can shower or take a bath. If you had an episiotomy or a perineal tear during labor and delivery, your health care provider may tell you not to take baths for a certain length of time. °· Wear a bra that supports your breasts and fits you well. °· If possible, have someone help you with household activities and help care for your baby for at least a few days after   you leave the hospital. °· Keep all follow-up visits for you and your baby as told by your health care provider. This is important. °Contact a health care provider if: °· You have: °? Vaginal discharge that has a bad smell. °? Difficulty urinating. °? Pain when urinating. °? A sudden increase or decrease in the frequency of your bowel movements. °? More redness, swelling, or pain around your episiotomy or vaginal tear. °? More fluid or blood coming from your episiotomy or  vaginal tear. °? Pus or a bad smell coming from your episiotomy or vaginal tear. °? A fever. °? A rash. °? Little or no interest in activities you used to enjoy. °? Questions about caring for yourself or your baby. °· Your episiotomy or vaginal tear feels warm to the touch. °· Your episiotomy or vaginal tear is separating or does not appear to be healing. °· Your breasts are painful, hard, or turn red. °· You feel unusually sad or worried. °· You feel nauseous or you vomit. °· You pass large blood clots from your vagina. If you pass a blood clot from your vagina, save it to show to your health care provider. Do not flush blood clots down the toilet without having your health care provider look at them. °· You urinate more than usual. °· You are dizzy or light-headed. °· You have not breastfed at all and you have not had a menstrual period for 12 weeks after delivery. °· You have stopped breastfeeding and you have not had a menstrual period for 12 weeks after you stopped breastfeeding. °Get help right away if: °· You have: °? Pain that does not go away or does not get better with medicine. °? Chest pain. °? Difficulty breathing. °? Blurred vision or spots in your vision. °? Thoughts about hurting yourself or your baby. °· You develop pain in your abdomen or in one of your legs. °· You develop a severe headache. °· You faint. °· You bleed from your vagina so much that you fill two sanitary pads in one hour. °This information is not intended to replace advice given to you by your health care provider. Make sure you discuss any questions you have with your health care provider. °Document Released: 05/06/2000 Document Revised: 10/21/2015 Document Reviewed: 05/24/2015 °Elsevier Interactive Patient Education © 2018 [employer] ° °

## 2018-02-12 ENCOUNTER — Ambulatory Visit: Payer: Medicaid Other | Admitting: Women's Health

## 2018-02-15 ENCOUNTER — Ambulatory Visit: Payer: Medicaid Other | Admitting: Women's Health

## 2018-02-23 ENCOUNTER — Other Ambulatory Visit: Payer: Self-pay

## 2018-02-23 ENCOUNTER — Emergency Department (HOSPITAL_BASED_OUTPATIENT_CLINIC_OR_DEPARTMENT_OTHER): Payer: Medicaid Other

## 2018-02-23 ENCOUNTER — Encounter (HOSPITAL_BASED_OUTPATIENT_CLINIC_OR_DEPARTMENT_OTHER): Payer: Self-pay | Admitting: *Deleted

## 2018-02-23 ENCOUNTER — Emergency Department (HOSPITAL_BASED_OUTPATIENT_CLINIC_OR_DEPARTMENT_OTHER)
Admission: EM | Admit: 2018-02-23 | Discharge: 2018-02-23 | Disposition: A | Discharge status: Home / Self Care | Payer: Medicaid Other | Attending: Emergency Medicine | Admitting: Emergency Medicine

## 2018-02-23 DIAGNOSIS — F909 Attention-deficit hyperactivity disorder, unspecified type: Secondary | ICD-10-CM | POA: Diagnosis not present

## 2018-02-23 DIAGNOSIS — J449 Chronic obstructive pulmonary disease, unspecified: Secondary | ICD-10-CM | POA: Insufficient documentation

## 2018-02-23 DIAGNOSIS — Z87891 Personal history of nicotine dependence: Secondary | ICD-10-CM | POA: Insufficient documentation

## 2018-02-23 DIAGNOSIS — R1031 Right lower quadrant pain: Secondary | ICD-10-CM | POA: Diagnosis not present

## 2018-02-23 DIAGNOSIS — Z79899 Other long term (current) drug therapy: Secondary | ICD-10-CM | POA: Insufficient documentation

## 2018-02-23 LAB — COMPREHENSIVE METABOLIC PANEL
ALBUMIN: 3.5 g/dL (ref 3.5–5.0)
ALT: 60 U/L — ABNORMAL HIGH (ref 0–44)
ANION GAP: 4 — AB (ref 5–15)
AST: 30 U/L (ref 15–41)
Alkaline Phosphatase: 82 U/L (ref 38–126)
BUN: 15 mg/dL (ref 6–20)
CO2: 27 mmol/L (ref 22–32)
Calcium: 8.7 mg/dL — ABNORMAL LOW (ref 8.9–10.3)
Chloride: 106 mmol/L (ref 98–111)
Creatinine, Ser: 0.71 mg/dL (ref 0.44–1.00)
GFR calc non Af Amer: >60 mL/min (ref >60)
GLUCOSE: 101 mg/dL — AB (ref 70–99)
POTASSIUM: 4 mmol/L (ref 3.5–5.1)
Sodium: 137 mmol/L (ref 135–145)
Total Bilirubin: 0.6 mg/dL (ref 0.3–1.2)
Total Protein: 6.2 g/dL — ABNORMAL LOW (ref 6.5–8.1)

## 2018-02-23 LAB — URINALYSIS, ROUTINE W REFLEX MICROSCOPIC
BILIRUBIN URINE: NEGATIVE
Glucose, UA: NEGATIVE mg/dL
HGB URINE DIPSTICK: NEGATIVE
Ketones, ur: NEGATIVE mg/dL
Leukocytes, UA: NEGATIVE
NITRITE: NEGATIVE
PH: 7.5 (ref 5.0–8.0)
Protein, ur: NEGATIVE mg/dL
Specific Gravity, Urine: 1.015 (ref 1.005–1.030)

## 2018-02-23 LAB — CBC WITH DIFFERENTIAL/PLATELET
BASOS PCT: 0 %
Basophils Absolute: 0 10*3/uL (ref 0.0–0.1)
EOS ABS: 0.2 10*3/uL (ref 0.0–0.7)
EOS PCT: 2 %
HCT: 38.7 % (ref 36.0–46.0)
HEMOGLOBIN: 13 g/dL (ref 12.0–15.0)
Lymphocytes Relative: 22 %
Lymphs Abs: 1.9 10*3/uL (ref 0.7–4.0)
MCH: 29.3 pg (ref 26.0–34.0)
MCHC: 33.6 g/dL (ref 30.0–36.0)
MCV: 87.2 fL (ref 78.0–100.0)
MONOS PCT: 7 %
Monocytes Absolute: 0.6 10*3/uL (ref 0.1–1.0)
NEUTROS PCT: 69 %
Neutro Abs: 5.9 10*3/uL (ref 1.7–7.7)
PLATELETS: 316 10*3/uL (ref 150–400)
RBC: 4.44 MIL/uL (ref 3.87–5.11)
RDW: 13.3 % (ref 11.5–15.5)
WBC: 8.6 10*3/uL (ref 4.0–10.5)

## 2018-02-23 LAB — PREGNANCY, URINE: Preg Test, Ur: NEGATIVE

## 2018-02-23 MED ORDER — ONDANSETRON HCL 4 MG/2ML IJ SOLN
4.0000 mg | Freq: Once | INTRAMUSCULAR | Status: AC
  Administered 2018-02-23: 4 mg via INTRAVENOUS
  Filled 2018-02-23: qty 2

## 2018-02-23 MED ORDER — SODIUM CHLORIDE 0.9 % IV BOLUS
1000.0000 mL | Freq: Once | INTRAVENOUS | Status: AC
  Administered 2018-02-23: 1000 mL via INTRAVENOUS

## 2018-02-23 MED ORDER — SODIUM CHLORIDE 0.9 % IV SOLN
INTRAVENOUS | Status: DC
  Administered 2018-02-23: 22:00:00 via INTRAVENOUS

## 2018-02-23 MED ORDER — KETOROLAC TROMETHAMINE 15 MG/ML IJ SOLN
15.0000 mg | Freq: Once | INTRAMUSCULAR | Status: AC
  Administered 2018-02-23: 15 mg via INTRAVENOUS
  Filled 2018-02-23: qty 1

## 2018-02-23 MED ORDER — IOPAMIDOL (ISOVUE-300) INJECTION 61%
100.0000 mL | Freq: Once | INTRAVENOUS | Status: AC | PRN
  Administered 2018-02-23: 100 mL via INTRAVENOUS

## 2018-02-23 NOTE — ED Provider Notes (Signed)
[facility] EMERGENCY DEPARTMENT Provider Note   CSN: [id number] Arrival date & time: 02/23/18  1803     History   Chief Complaint Chief Complaint  Patient presents with  . Abdominal Pain    HPI Anetria Harwick is a 17 y.o. female.  The history is provided by the patient.  Abdominal Pain   This is a new problem. The current episode started 12 to 24 hours ago. The problem occurs constantly. The problem has been gradually worsening. The pain is associated with an unknown factor. The pain is located in the RLQ. The quality of the pain is aching and shooting. The pain is at a severity of 7/10. The pain is moderate. Associated symptoms include anorexia, nausea and vomiting. Pertinent negatives include fever, diarrhea, dysuria and frequency. Associated symptoms comments: No vaginal bleeding or d/c.  Uncomplicated vaginal delivery 6 weeks ago.  No sexual contact since delivery. The symptoms are aggravated by activity. The symptoms are relieved by being still. Her past medical history is significant for gallstones. Past medical history comments: s/p cholecystectomy several years ago.    Past Medical History:  Diagnosis Date  . Acute pancreatitis 10/08/2013  . ADHD (attention deficit hyperactivity disorder)   . Anxiety   . Asthma    Last asthmatic episode approx. 1 yr ago  . Eating disorder    Pt states that she is anorexic and bulemic; MDs made aware of pt. self-diagnosis  . Gallstones 10/15/2013  . Headache(784.0)   . Symptomatic cholelithiasis 12/31/2013    Patient Active Problem List   Diagnosis Date Noted  . Personal history of previous postdates pregnancy 01/06/2018  . Drug use affecting pregnancy 06/02/2017  . Pregnancy 06/01/2017  . Gastroesophageal reflux disease without esophagitis 10/30/2015  . Panic disorder 07/15/2015  . Substance abuse (HCC) 09/18/2014  . Nicotine abuse 10/15/2013  . 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  Disordered eating 10/15/2013  . Menorrhagia with irregular cycle 10/08/2013    Past Surgical History:  Procedure Laterality Date  . CHOLECYSTECTOMY N/A 12/31/2013   Procedure: LAPAROSCOPIC CHOLECYSTECTOMY ;  Surgeon: Judie Petit. Leonia Corona, MD;  Location: [facility] OR;  Service: Pediatrics;  Laterality: N/A;     OB History    Gravida  1   Para  1   Term  1   Preterm      AB      Living  1     SAB      TAB      Ectopic      Multiple  0   Live Births  1            Home Medications    Prior to Admission medications   Medication Sig Start Date End Date Taking? Authorizing Provider  calcium carbonate (TUMS - DOSED IN MG ELEMENTAL CALCIUM) 500 MG chewable tablet Chew 1 tablet by mouth 2 (two) times daily as needed for indigestion or heartburn.     [provider]  Prenatal Vit-Fe Fumarate-FA (MULTIVITAMIN-PRENATAL) 27-0.8 MG TABS tablet Take 1 tablet by mouth daily at 12 noon.    [provider]  ranitidine (ZANTAC) 150 MG tablet Take 1 tablet (150 mg total) by mouth 2 (two) times daily. 12/04/17   Cheral Marker, CNM    Family History Family History  Adopted: Yes  Problem Relation Age of Onset  . Alcohol abuse Mother   . Depression Mother   . Drug abuse Mother   . Heart disease Maternal  Grandmother   . Hypertension Maternal Grandmother   . Depression Maternal Grandmother   . Heart disease Maternal Aunt   . Hypertension Maternal Aunt   . Depression Maternal Aunt     Social History Social History   Tobacco Use  . Smoking status: Former Smoker    Last attempt to quit: 07/2017    Years since quitting: 0.5  . Smokeless tobacco: Never Used  Substance Use Topics  . Alcohol use: Not Currently    Alcohol/week: 0.0 standard drinks  . Drug use: Not Currently    Types: Marijuana, Cocaine    Comment: before pregnancy     Allergies   Penicillins and Promethazine   Review of Systems Review of Systems  Constitutional: Negative for fever.    Gastrointestinal: Positive for abdominal pain, anorexia, nausea and vomiting. Negative for diarrhea.  Genitourinary: Negative for dysuria and frequency.  All other systems reviewed and are negative.    Physical Exam Updated Vital Signs BP 121/84   Pulse (!) 102   Temp 98.3 F (36.8 C) (Oral)   Resp 20   Ht 5\' 7"  (1.702 m)   Wt 72.6 kg   SpO2 100%   BMI 25.06 kg/m   Physical Exam  Constitutional: She is oriented to person, place, and time. She appears well-developed and well-nourished. No distress.  HENT:  Head: Normocephalic and atraumatic.  Mouth/Throat: Oropharynx is clear and moist.  Eyes: Pupils are equal, round, and reactive to light. Conjunctivae and EOM are normal.  Neck: Normal range of motion. Neck supple.  Cardiovascular: Normal rate, regular rhythm and intact distal pulses.  No murmur heard. Pulmonary/Chest: Effort normal and breath sounds normal. No respiratory distress. She has no wheezes. She has no rales.  Abdominal: Soft. She exhibits no distension. There is tenderness in the right lower quadrant. There is rebound, guarding and tenderness at McBurney's point. There is no CVA tenderness. No hernia.  Musculoskeletal: Normal range of motion. She exhibits no edema or tenderness.  Neurological: She is alert and oriented to person, place, and time.  Skin: Skin is warm and dry. No rash noted. No erythema.  Psychiatric: She has a normal mood and affect. Her behavior is normal.  Nursing note and vitals reviewed.    ED Treatments / Results  Labs (all labs ordered are listed, but only abnormal results are displayed) Labs Reviewed  URINALYSIS, ROUTINE W REFLEX MICROSCOPIC - Abnormal; Notable for the following components:      Result Value   APPearance CLOUDY (*)    All other components within normal limits  COMPREHENSIVE METABOLIC PANEL - Abnormal; Notable for the following components:   Glucose, Bld 101 (*)    Calcium 8.7 (*)    Total Protein 6.2 (*)    ALT 60  (*)    Anion gap 4 (*)    All other components within normal limits  CBC WITH DIFFERENTIAL/PLATELET  PREGNANCY, URINE    EKG None  Radiology US Transvaginal Non-ob  Result Date: 02/23/2018 CLINICAL DATA:  Right lower quadrant pain.  Nausea and vomiting. EXAM: TRANSABDOMINAL AND TRANSVAGINAL ULTRASOUND OF PELVIS DOPPLER ULTRASOUND OF OVARIES TECHNIQUE: Both transabdominal and transvaginal ultrasound examinations of the pelvis were performed. Transabdominal technique was performed for global imaging of the pelvis including uterus, ovaries, adnexal regions, and pelvic cul-de-sac. It was necessary to proceed with endovaginal exam following the transabdominal exam to visualize the endometrium and ovaries. Color and duplex Doppler ultrasound was utilized to evaluate blood flow to the ovaries. COMPARISON:  CT  scan February 23, 2018 FINDINGS: Uterus Measurements: 11.4 x 4.3 x 7.6 cm. No fibroids or other mass visualized. Endometrium Thickness: 17 mm. No focal mass or hyperemia within the endometrium. Few scattered echogenic foci are seen throughout the endometrium, of doubtful significance. Right ovary Measurements: 4.2 x 2.3 x 3 cm.  Normal appearance/no adnexal mass. Left ovary Measurements: 3.6 x 1.9 x 2.4 cm. There is a 3.8 x 1.8 x 2.3 cm cystic structure in the left adnexa, apparently separate from the left ovary. Pulsed Doppler evaluation of both ovaries demonstrates normal low-resistance arterial and venous waveforms. Other findings No abnormal free fluid. IMPRESSION: 1. The endometrium measures 17 mm but no endometrial hypervascularity or endometrial mass is identified. Findings are nonspecific 6 weeks after delivery. If there is concern for retained products, hypovascular retained products cannot be excluded on this study. Consider short-term follow-up pelvic ultrasound or further evaluation with pelvic MRI with and without contrast for evaluation if clinically warranted. 2. 3.8 cm cystic structure in  the left adnexa could represent adnexal cyst. This is of doubtful acute significance. 3. The uterus and ovaries are otherwise normal. Electronically Signed   By: Gerome Sam III M.D   On: 02/23/2018 22:10   US Pelvis Complete  Result Date: 02/23/2018 CLINICAL DATA:  Right lower quadrant pain.  Nausea and vomiting. EXAM: TRANSABDOMINAL AND TRANSVAGINAL ULTRASOUND OF PELVIS DOPPLER ULTRASOUND OF OVARIES TECHNIQUE: Both transabdominal and transvaginal ultrasound examinations of the pelvis were performed. Transabdominal technique was performed for global imaging of the pelvis including uterus, ovaries, adnexal regions, and pelvic cul-de-sac. It was necessary to proceed with endovaginal exam following the transabdominal exam to visualize the endometrium and ovaries. Color and duplex Doppler ultrasound was utilized to evaluate blood flow to the ovaries. COMPARISON:  CT scan February 23, 2018 FINDINGS: Uterus Measurements: 11.4 x 4.3 x 7.6 cm. No fibroids or other mass visualized. Endometrium Thickness: 17 mm. No focal mass or hyperemia within the endometrium. Few scattered echogenic foci are seen throughout the endometrium, of doubtful significance. Right ovary Measurements: 4.2 x 2.3 x 3 cm.  Normal appearance/no adnexal mass. Left ovary Measurements: 3.6 x 1.9 x 2.4 cm. There is a 3.8 x 1.8 x 2.3 cm cystic structure in the left adnexa, apparently separate from the left ovary. Pulsed Doppler evaluation of both ovaries demonstrates normal low-resistance arterial and venous waveforms. Other findings No abnormal free fluid. IMPRESSION: 1. The endometrium measures 17 mm but no endometrial hypervascularity or endometrial mass is identified. Findings are nonspecific 6 weeks after delivery. If there is concern for retained products, hypovascular retained products cannot be excluded on this study. Consider short-term follow-up pelvic ultrasound or further evaluation with pelvic MRI with and without contrast for evaluation  if clinically warranted. 2. 3.8 cm cystic structure in the left adnexa could represent adnexal cyst. This is of doubtful acute significance. 3. The uterus and ovaries are otherwise normal. Electronically Signed   By: Gerome Sam III M.D   On: 02/23/2018 22:10   Ct Abdomen Pelvis W Contrast  Result Date: 02/23/2018 CLINICAL DATA:  RIGHT lower quadrant pain for 10 hours. Constipation since vaginal delivery 6 weeks ago. EXAM: CT ABDOMEN AND PELVIS WITH CONTRAST TECHNIQUE: Multidetector CT imaging of the abdomen and pelvis was performed using the standard protocol following bolus administration of intravenous contrast. CONTRAST:  ISOVUE-300 IOPAMIDOL (ISOVUE-300) INJECTION 61% COMPARISON:  None. FINDINGS: LOWER CHEST: Lung bases are clear. Included heart size is normal. No pericardial effusion. HEPATOBILIARY: Status post cholecystectomy. Negative liver,  mild focal fatty infiltration about the falciform ligament. PANCREAS: Normal. SPLEEN: Normal. ADRENALS/URINARY TRACT: Kidneys are orthotopic, demonstrating symmetric enhancement. No nephrolithiasis, hydronephrosis or solid renal masses. The unopacified ureters are normal in course and caliber. Delayed imaging through the kidneys demonstrates symmetric prompt contrast excretion within the proximal urinary collecting system. Slight distal RIGHT ureter enhancement. Urinary bladder is well distended, mild anterior wall thickening. Normal adrenal glands. STOMACH/BOWEL: The stomach, small and large bowel are normal in course and caliber without inflammatory changes. Mild amount of retained large bowel stool. Normal appendix. VASCULAR/LYMPHATIC: Aortoiliac vessels are normal in course and caliber. No lymphadenopathy by CT size criteria. REPRODUCTIVE: 15 mm fluid distended endometrium. OTHER: Small amount of free fluid in the pelvis with pelvic fat stranding. MUSCULOSKELETAL: Nonacute. IMPRESSION: 1. Inflammatory changes in pelvis with small amount of free fluid.  Normal appendix. Above findings may be related to PID or post delivery infection. 2. Mild anterior bladder wall enhancement and focal RIGHT urothelial enhancement may be reactive from above findings or, UTI. 3. Fluid distended endometrium, if there is a concern for retained products of conception consider pelvic ultrasound. Electronically Signed   By: Awilda Metro M.D.   On: 02/23/2018 20:41   [place] Art/ven Flow Abd Pelv Doppler  Result Date: 02/23/2018 CLINICAL DATA:  Right lower quadrant pain.  Nausea and vomiting. EXAM: TRANSABDOMINAL AND TRANSVAGINAL ULTRASOUND OF PELVIS DOPPLER ULTRASOUND OF OVARIES TECHNIQUE: Both transabdominal and transvaginal ultrasound examinations of the pelvis were performed. Transabdominal technique was performed for global imaging of the pelvis including uterus, ovaries, adnexal regions, and pelvic cul-de-sac. It was necessary to proceed with endovaginal exam following the transabdominal exam to visualize the endometrium and ovaries. Color and duplex Doppler ultrasound was utilized to evaluate blood flow to the ovaries. COMPARISON:  CT scan February 23, 2018 FINDINGS: Uterus Measurements: 11.4 x 4.3 x 7.6 cm. No fibroids or other mass visualized. Endometrium Thickness: 17 mm. No focal mass or hyperemia within the endometrium. Few scattered echogenic foci are seen throughout the endometrium, of doubtful significance. Right ovary Measurements: 4.2 x 2.3 x 3 cm.  Normal appearance/no adnexal mass. Left ovary Measurements: 3.6 x 1.9 x 2.4 cm. There is a 3.8 x 1.8 x 2.3 cm cystic structure in the left adnexa, apparently separate from the left ovary. Pulsed Doppler evaluation of both ovaries demonstrates normal low-resistance arterial and venous waveforms. Other findings No abnormal free fluid. IMPRESSION: 1. The endometrium measures 17 mm but no endometrial hypervascularity or endometrial mass is identified. Findings are nonspecific 6 weeks after delivery. If there is concern for  retained products, hypovascular retained products cannot be excluded on this study. Consider short-term follow-up pelvic ultrasound or further evaluation with pelvic MRI with and without contrast for evaluation if clinically warranted. 2. 3.8 cm cystic structure in the left adnexa could represent adnexal cyst. This is of doubtful acute significance. 3. The uterus and ovaries are otherwise normal. Electronically Signed   By: Gerome Sam III M.D   On: 02/23/2018 22:10    Procedures Procedures (including critical care time)  Medications Ordered in ED Medications - No data to display   Initial Impression / Assessment and Plan / ED Course  I have reviewed the triage vital signs and the nursing notes.  Pertinent labs & imaging results that were available during my care of the patient were reviewed by me and considered in my medical decision making (see chart for details).   Patient is a 17 year old female with a history of substance abuse, recent  vaginal delivery 6 weeks ago and GERD who is presenting with right lower quadrant pain.  She woke up with the pain today and is worsened throughout the day.  She had 2 episodes of emesis and movement makes the pain worse.  On exam she has right lower quadrant tenderness with rebound and guarding.  Prior surgeries are cholecystectomy several years ago but that is it.  She denies any urinary symptoms, vaginal symptoms.  She has not been sexually active since having the child. Concern for ovarian allergies such as torsion or cyst rupture versus appendicitis.  CBC, CMP, UA, UPT and CT abdomen pelvis pending.  Patient does not want pain medication but when she was given IV Zofran.  9:04 PM 's labs are reassuring including a urine without signs of UTI.  UPT is negative.  CT shows inflammatory changes in the pelvis was a small amount of free fluid and a normal appendix.  The findings could be related to PID O postdelivery infection.  She also has mild anterior  bladder wall enhancement and focal right urothelial enhancement which they state may be reactive.  Patient has no findings consistent with the UTI.  Ultrasound was recommended.  10:43 PM US showed thickening of the endometrium but no other acute findings.  Patient has no symptoms suggestive of retained products.  She is not having any fever, foul discharge or bleeding.  She has more right-sided pain than suprapubic pain.  Patient is feeling better after Toradol.  Recommended that patient follow-up with her OB/GYN for repeat evaluation and given strict return precautions.  Final Clinical Impressions(s) / ED Diagnoses   Final diagnoses:  Right lower quadrant abdominal pain    ED Discharge Orders    None       Gwyneth Sprout, MD 02/23/18 2244

## 2018-02-23 NOTE — ED Triage Notes (Signed)
Right lower quadrant pain today. She had a vaginal full term delivery 6 weeks ago.

## 2018-02-23 NOTE — Discharge Instructions (Signed)
If you develop fever, vaginal discharge or odor, persistent vomiting or severely worsening pain you need to been seen by your OB or return here for repeat evaluation.

## 2018-02-23 NOTE — ED Notes (Signed)
ED Provider at bedside. 

## 2018-02-23 NOTE — ED Notes (Signed)
Waiting on upreg results prior to imaging

## 2018-02-23 NOTE — ED Notes (Signed)
Patient transported to CT 

## 2018-03-08 ENCOUNTER — Ambulatory Visit: Payer: Medicaid Other | Admitting: Women's Health

## 2018-03-22 ENCOUNTER — Ambulatory Visit (INDEPENDENT_AMBULATORY_CARE_PROVIDER_SITE_OTHER): Payer: Medicaid Other | Admitting: Advanced Practice Midwife

## 2018-03-22 ENCOUNTER — Encounter (INDEPENDENT_AMBULATORY_CARE_PROVIDER_SITE_OTHER): Payer: Self-pay

## 2018-03-22 ENCOUNTER — Other Ambulatory Visit: Payer: Self-pay

## 2018-03-22 ENCOUNTER — Encounter: Payer: Self-pay | Admitting: Advanced Practice Midwife

## 2018-03-22 MED ORDER — ZOLPIDEM TARTRATE 5 MG PO TABS: 5.0000 mg | ORAL_TABLET | Freq: Every evening | ORAL | 3 refills | Status: DC | PRN

## 2018-03-22 NOTE — Progress Notes (Addendum)
Beth Key is a 17 y.o. who presents for a postpartum visit. She is 9 weeks postpartum following a spontaneous vaginal delivery. I have fully reviewed the prenatal and intrapartum course. The delivery was at 41.3 gestational weeks.  Anesthesia: epidural. Postpartum course has been uneventful. Baby's course has been uneventful. Baby is feeding by bottle. Bleeding: no bleeding. Bowel function is normal. Bladder function is normal. Patient is sexually active. Contraception method is condoms. Postpartum depression screening: negative.   Current Outpatient Medications:  .  zolpidem (AMBIEN) 5 MG tablet, Take 1 tablet (5 mg total) by mouth at bedtime as needed for sleep., Disp: 15 tablet, Rfl: 3  Review of Systems   Constitutional: Negative for fever and chills Eyes: Negative for visual disturbances Respiratory: Negative for shortness of breath, dyspnea Cardiovascular: Negative for chest pain or palpitations  Gastrointestinal: Negative for vomiting, diarrhea and constipation Genitourinary: Negative for dysuria and urgency Musculoskeletal: Negative for back pain, joint pain, myalgias  Neurological: Negative for dizziness and headaches Psyche:  Difficulty sleeping ever since delivering and staying up for 4 days.  Baby sleeps throguh the night, pt goes to school in the am    Objective:     Vitals:   03/22/18 1339  BP: 119/80  Pulse: (!) 103   General:  alert, cooperative and no distress   Breasts:  negative  Lungs: Normal respiratory effort  Heart:  regular rate and rhythm  Abdomen: Soft, nontender   Vulva:  normal  Vagina: normal vagina  Cervix:  closed  Corpus: Well involuted     Rectal Exam: no hemorrhoids        Assessment:     normal postpartum exam.  Plan:   1. Contraception: Nexplanon and no sex until then 2. Follow up in: 3 week for nexplanon or as needed.  Rx ambien 5mg  try 1/2 tablet-1 tablet

## 2018-03-22 NOTE — Addendum Note (Signed)
Addended by: Jacklyn Shell on: 03/22/2018 02:29 PM   Modules accepted: Orders

## 2018-03-22 NOTE — Patient Instructions (Signed)
Sleep hygiene 

## 2018-04-12 ENCOUNTER — Ambulatory Visit (INDEPENDENT_AMBULATORY_CARE_PROVIDER_SITE_OTHER): Payer: Medicaid Other | Admitting: Advanced Practice Midwife

## 2018-04-12 ENCOUNTER — Other Ambulatory Visit: Payer: Self-pay

## 2018-04-12 VITALS — BP 127/89 | HR 111 | Ht 67.0 in | Wt 177.0 lb

## 2018-04-12 DIAGNOSIS — Z3049 Encounter for surveillance of other contraceptives: Secondary | ICD-10-CM

## 2018-04-12 DIAGNOSIS — Z30017 Encounter for initial prescription of implantable subdermal contraceptive: Secondary | ICD-10-CM

## 2018-04-12 DIAGNOSIS — Z3202 Encounter for pregnancy test, result negative: Secondary | ICD-10-CM

## 2018-04-12 LAB — POCT URINE PREGNANCY: Preg Test, Ur: NEGATIVE

## 2018-04-12 MED ORDER — ETONOGESTREL 68 MG ~~LOC~~ IMPL
68.0000 mg | DRUG_IMPLANT | Freq: Once | SUBCUTANEOUS | Status: AC
  Administered 2018-04-12: 68 mg via SUBCUTANEOUS

## 2018-04-12 NOTE — Progress Notes (Signed)
  HPI:  Beth Key is a 17 y.o. year old female here for Nexplanon insertion.  She has been abstinent for 3 weeks, started a period 3 days ago and her pregnancy test today was negative.  Risks/benefits/side effects of Nexplanon have been discussed and her questions have been answered.  Specifically, a failure rate of 05/998 has been reported, with an increased failure rate if pt takes St. John's Wort and/or antiseizure medicaitons.  Beth Key is aware of the common side effect of irregular bleeding, which the incidence of decreases over time.   Past Medical History: Past Medical History:  Diagnosis Date  . Acute pancreatitis 10/08/2013  . ADHD (attention deficit hyperactivity disorder)   . Anxiety   . Asthma    Last asthmatic episode approx. 1 yr ago  . Eating disorder    Pt states that she is anorexic and bulemic; MDs made aware of pt. self-diagnosis  . Gallstones 10/15/2013  . Headache(784.0)   . Symptomatic cholelithiasis 12/31/2013    Past Surgical History: Past Surgical History:  Procedure Laterality Date  . CHOLECYSTECTOMY N/A 12/31/2013   Procedure: LAPAROSCOPIC CHOLECYSTECTOMY ;  Surgeon: Judie PetitM. Leonia CoronaShuaib Farooqui, MD;  Location: [facility] OR;  Service: Pediatrics;  Laterality: N/A;    Family History: Family History  Adopted: Yes  Problem Relation Age of Onset  . Alcohol abuse Mother   . Depression Mother   . Drug abuse Mother   . Heart disease Maternal Grandmother   . Hypertension Maternal Grandmother   . Depression Maternal Grandmother   . Heart disease Maternal Aunt   . Hypertension Maternal Aunt   . Depression Maternal Aunt     Social History: Social History   Tobacco Use  . Smoking status: Former Smoker    Last attempt to quit: 07/2017    Years since quitting: 0.7  . Smokeless tobacco: Never Used  Substance Use Topics  . Alcohol use: Not Currently    Alcohol/week: 0.0 standard drinks  . Drug use: Not Currently    Types: Marijuana, Cocaine    Comment:  before pregnancy    Allergies:  Allergies  Allergen Reactions  . Penicillins Rash    Has patient had a PCN reaction causing immediate rash, facial/tongue/throat swelling, SOB or lightheadedness with hypotension: Yes Has patient had a PCN reaction causing severe rash involving mucus membranes or skin necrosis: No Has patient had a PCN reaction that required hospitalization: No Has patient had a PCN reaction occurring within the last 10 years: No If all of the above answers are "NO", then may proceed with Cephalosporin use.   . Promethazine Other (See Comments)    Dystonic reaction to IV Promethazine (jerky movements)      Her left arm, approximatly 4 inches proximal from the elbow, was cleansed with alcohol and anesthetized with 2cc of 2% Lidocaine.  The area was cleansed again and the Nexplanon was inserted without difficulty.  A pressure bandage was applied.  Pt was instructed to remove pressure bandage in a few hours, and keep insertion site covered with a bandaid for 3 days.  Back up contraception was recommended for 2 weeks.  Follow-up scheduled PRN problems  Beth Key 04/12/2018 4:23 PM

## 2018-06-08 ENCOUNTER — Emergency Department (HOSPITAL_BASED_OUTPATIENT_CLINIC_OR_DEPARTMENT_OTHER)
Admission: EM | Admit: 2018-06-08 | Discharge: 2018-06-08 | Disposition: A | Discharge status: Home / Self Care | Payer: Medicaid Other | Attending: Emergency Medicine | Admitting: Emergency Medicine

## 2018-06-08 ENCOUNTER — Emergency Department (HOSPITAL_BASED_OUTPATIENT_CLINIC_OR_DEPARTMENT_OTHER): Payer: Medicaid Other

## 2018-06-08 ENCOUNTER — Other Ambulatory Visit: Payer: Self-pay

## 2018-06-08 ENCOUNTER — Encounter (HOSPITAL_BASED_OUTPATIENT_CLINIC_OR_DEPARTMENT_OTHER): Payer: Self-pay | Admitting: Emergency Medicine

## 2018-06-08 DIAGNOSIS — Z87891 Personal history of nicotine dependence: Secondary | ICD-10-CM | POA: Diagnosis not present

## 2018-06-08 DIAGNOSIS — J45909 Unspecified asthma, uncomplicated: Secondary | ICD-10-CM | POA: Diagnosis not present

## 2018-06-08 DIAGNOSIS — R1031 Right lower quadrant pain: Secondary | ICD-10-CM | POA: Diagnosis not present

## 2018-06-08 DIAGNOSIS — R112 Nausea with vomiting, unspecified: Secondary | ICD-10-CM | POA: Diagnosis present

## 2018-06-08 LAB — CBC WITH DIFFERENTIAL/PLATELET
ABS IMMATURE GRANULOCYTES: 0.09 10*3/uL — AB (ref 0.00–0.07)
Basophils Absolute: 0 10*3/uL (ref 0.0–0.1)
Basophils Relative: 0 %
Eosinophils Absolute: 0.1 10*3/uL (ref 0.0–0.5)
Eosinophils Relative: 1 %
HCT: 44.1 % (ref 36.0–46.0)
Hemoglobin: 14.2 g/dL (ref 12.0–15.0)
IMMATURE GRANULOCYTES: 1 %
LYMPHS ABS: 0.5 10*3/uL — AB (ref 0.7–4.0)
LYMPHS PCT: 3 %
MCH: 28.8 pg (ref 26.0–34.0)
MCHC: 32.2 g/dL (ref 30.0–36.0)
MCV: 89.5 fL (ref 80.0–100.0)
MONOS PCT: 5 %
Monocytes Absolute: 0.8 10*3/uL (ref 0.1–1.0)
NRBC: 0 % (ref 0.0–0.2)
Neutro Abs: 15.7 10*3/uL — ABNORMAL HIGH (ref 1.7–7.7)
Neutrophils Relative %: 90 %
PLATELETS: 331 10*3/uL (ref 150–400)
RBC: 4.93 MIL/uL (ref 3.87–5.11)
RDW: 13.1 % (ref 11.5–15.5)
WBC: 17.2 10*3/uL — ABNORMAL HIGH (ref 4.0–10.5)

## 2018-06-08 LAB — COMPREHENSIVE METABOLIC PANEL
ALT: 24 U/L (ref 0–44)
AST: 23 U/L (ref 15–41)
Albumin: 3.9 g/dL (ref 3.5–5.0)
Alkaline Phosphatase: 70 U/L (ref 38–126)
Anion gap: 6 (ref 5–15)
BUN: 23 mg/dL — AB (ref 6–20)
CHLORIDE: 107 mmol/L (ref 98–111)
CO2: 22 mmol/L (ref 22–32)
CREATININE: 0.68 mg/dL (ref 0.44–1.00)
Calcium: 8.6 mg/dL — ABNORMAL LOW (ref 8.9–10.3)
GFR calc non Af Amer: >60 mL/min (ref >60)
Glucose, Bld: 112 mg/dL — ABNORMAL HIGH (ref 70–99)
Potassium: 4.2 mmol/L (ref 3.5–5.1)
SODIUM: 135 mmol/L (ref 135–145)
Total Bilirubin: 0.5 mg/dL (ref 0.3–1.2)
Total Protein: 7.3 g/dL (ref 6.5–8.1)

## 2018-06-08 LAB — URINALYSIS, ROUTINE W REFLEX MICROSCOPIC
BILIRUBIN URINE: NEGATIVE
GLUCOSE, UA: NEGATIVE mg/dL
KETONES UR: NEGATIVE mg/dL
LEUKOCYTES UA: NEGATIVE
Nitrite: NEGATIVE
PH: 6 (ref 5.0–8.0)
PROTEIN: NEGATIVE mg/dL
Specific Gravity, Urine: 1.02 (ref 1.005–1.030)

## 2018-06-08 LAB — URINALYSIS, MICROSCOPIC (REFLEX)

## 2018-06-08 LAB — PREGNANCY, URINE: Preg Test, Ur: NEGATIVE

## 2018-06-08 LAB — LIPASE, BLOOD: Lipase: 28 U/L (ref 11–51)

## 2018-06-08 MED ORDER — METOCLOPRAMIDE HCL 5 MG/ML IJ SOLN
10.0000 mg | Freq: Once | INTRAMUSCULAR | Status: AC
  Administered 2018-06-08: 10 mg via INTRAVENOUS
  Filled 2018-06-08: qty 2

## 2018-06-08 MED ORDER — ONDANSETRON HCL 4 MG/2ML IJ SOLN
4.0000 mg | Freq: Once | INTRAMUSCULAR | Status: AC
  Administered 2018-06-08: 4 mg via INTRAVENOUS
  Filled 2018-06-08: qty 2

## 2018-06-08 MED ORDER — METOCLOPRAMIDE HCL 10 MG PO TABS: 10.0000 mg | ORAL_TABLET | Freq: Four times a day (QID) | ORAL | 0 refills | Status: DC

## 2018-06-08 MED ORDER — SODIUM CHLORIDE 0.9 % IV BOLUS
1000.0000 mL | Freq: Once | INTRAVENOUS | Status: AC
  Administered 2018-06-08: 1000 mL via INTRAVENOUS

## 2018-06-08 MED ORDER — IOPAMIDOL (ISOVUE-300) INJECTION 61%
100.0000 mL | Freq: Once | INTRAVENOUS | Status: AC | PRN
  Administered 2018-06-08: 100 mL via INTRAVENOUS

## 2018-06-08 NOTE — Discharge Instructions (Signed)
Please read and follow all provided instructions.  Your diagnoses today include:  1. Non-intractable vomiting with nausea, unspecified vomiting type   2. RLQ abdominal pain     Tests performed today include:  Blood counts and electrolytes  Blood tests to check liver and kidney function  Blood tests to check pancreas function  Urine test to look for infection and pregnancy (in women)  CT scan - no signs of appendicitis or other problems  Vital signs. See below for your results today.   Medications prescribed:   Reglan - medication for nausea and vomiting  Take any prescribed medications only as directed.  Home care instructions:   Follow any educational materials contained in this packet.   Your abdominal pain, nausea, vomiting, and diarrhea may be caused by a viral gastroenteritis also called 'stomach flu'. You should rest for the next several days. Keep drinking plenty of fluids and use the medicine for nausea as directed.    Drink clear liquids for the next 24 hours and introduce solid foods slowly after 24 hours using the b.r.a.t. diet (Bananas, Rice, Applesauce, Toast, Yogurt).    Follow-up instructions: Please follow-up with your primary care provider in the next 3 days for further evaluation of your symptoms. If you are not feeling better in 48 hours you may have a condition that is more serious and you need re-evaluation.   Return instructions:  SEEK IMMEDIATE MEDICAL ATTENTION IF:  If you have pain that does not go away or becomes severe   A temperature above 101F develops   Repeated vomiting occurs (multiple episodes)   If you have pain that becomes localized to portions of the abdomen. The right side could possibly be appendicitis. In an adult, the left lower portion of the abdomen could be colitis or diverticulitis.   Blood is being passed in stools or vomit (bright red or black tarry stools)   You develop chest pain, difficulty breathing, dizziness or  fainting, or become confused, poorly responsive, or inconsolable (young children)  If you have any other emergent concerns regarding your health  Additional Information: Abdominal (belly) pain can be caused by many things. Your caregiver performed an examination and possibly ordered blood/urine tests and imaging (CT scan, x-rays, ultrasound). Many cases can be observed and treated at home after initial evaluation in the emergency department. Even though you are being discharged home, abdominal pain can be unpredictable. Therefore, you need a repeated exam if your pain does not resolve, returns, or worsens. Most patients with abdominal pain don't have to be admitted to the hospital or have surgery, but serious problems like appendicitis and gallbladder attacks can start out as nonspecific pain. Many abdominal conditions cannot be diagnosed in one visit, so follow-up evaluations are very important.  Your vital signs today were: BP 112/77 (BP Location: Left Arm)    Pulse (!) 108    Temp 98.2 F (36.8 C) (Oral)    Resp 16    Ht 5\' 7"  (1.702 m)    Wt 72.6 kg    LMP 06/01/2018    SpO2 100%    BMI 25.06 kg/m  If your blood pressure (bp) was elevated above 135/85 this visit, please have this repeated by your doctor within one month. --------------

## 2018-06-08 NOTE — ED Provider Notes (Signed)
[facility] EMERGENCY DEPARTMENT Provider Note   CSN: [id number] Arrival date & time: 06/08/18  4098     History   Chief Complaint Chief Complaint  Patient presents with  . Abdominal Pain    HPI Beth Key is a 18 y.o. female.  Patient with history of cholecystectomy, pancreatitis, depression --presents the emergency department with acute onset of abdominal pain starting at approximately 6 AM.  She had associated vomiting which awoke her from sleep.  Patient denies any fevers.  She has chest pain associated with vomiting.  No diarrhea, constipation, vaginal bleeding or discharge.  States that she vomited multiple times.  No treatments prior to arrival.  No known sick contacts.  States that she had a baby about 4 months ago.  She states that the pain is worse in her lower abdomen.  Pain does not radiate.  Course is constant.     Past Medical History:  Diagnosis Date  . Acute pancreatitis 10/08/2013  . ADHD (attention deficit hyperactivity disorder)   . Anxiety   . Asthma    Last asthmatic episode approx. 1 yr ago  . Eating disorder    Pt states that she is anorexic and bulemic; MDs made aware of pt. self-diagnosis  . Gallstones 10/15/2013  . Headache(784.0)   . Symptomatic cholelithiasis 12/31/2013    Patient Active Problem List   Diagnosis Date Noted  . Personal history of previous postdates pregnancy 01/06/2018  . Drug use affecting pregnancy 06/02/2017  . Pregnancy 06/01/2017  . Gastroesophageal reflux disease without esophagitis 10/30/2015  . Panic disorder 07/15/2015  . Substance abuse (HCC) 09/18/2014  . Nicotine abuse 10/15/2013  . 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 Disordered eating 10/15/2013  . Menorrhagia with irregular cycle 10/08/2013    Past Surgical History:  Procedure Laterality Date  . CHOLECYSTECTOMY N/A 12/31/2013   Procedure: LAPAROSCOPIC CHOLECYSTECTOMY ;  Surgeon: Judie Petit. Leonia Corona, MD;   Location: [facility] OR;  Service: Pediatrics;  Laterality: N/A;     OB History    Gravida  1   Para  1   Term  1   Preterm      AB      Living  1     SAB      TAB      Ectopic      Multiple  0   Live Births  1            Home Medications    Prior to Admission medications   Medication Sig Start Date End Date Taking? Authorizing Provider  zolpidem (AMBIEN) 5 MG tablet Take 1 tablet (5 mg total) by mouth at bedtime as needed for sleep. Patient not taking: Reported on 04/12/2018 03/22/18   Jacklyn Shell, CNM    Family History Family History  Adopted: Yes  Problem Relation Age of Onset  . Alcohol abuse Mother   . Depression Mother   . Drug abuse Mother   . Heart disease Maternal Grandmother   . Hypertension Maternal Grandmother   . Depression Maternal Grandmother   . Heart disease Maternal Aunt   . Hypertension Maternal Aunt   . Depression Maternal Aunt     Social History Social History   Tobacco Use  . Smoking status: Former Smoker    Last attempt to quit: 07/2017    Years since quitting: 0.8  . Smokeless tobacco: Never Used  Substance Use Topics  . Alcohol use: Not Currently    Alcohol/week: 0.0 standard  drinks  . Drug use: Not Currently    Types: Marijuana, Cocaine    Comment: before pregnancy     Allergies   Penicillins and Promethazine   Review of Systems Review of Systems  Constitutional: Negative for fever.  HENT: Negative for rhinorrhea and sore throat.   Eyes: Negative for redness.  Respiratory: Negative for cough and shortness of breath.   Cardiovascular: Positive for chest pain.  Gastrointestinal: Positive for abdominal pain, nausea and vomiting. Negative for diarrhea.  Genitourinary: Negative for dysuria, vaginal bleeding and vaginal discharge.  Musculoskeletal: Negative for myalgias.  Skin: Negative for rash.  Neurological: Negative for headaches.     Physical Exam Updated Vital Signs BP 120/85 (BP Location:  Left Arm)   Pulse (!) 118   Temp 98.5 F (36.9 C) (Oral)   Resp 18   Ht 5\' 7"  (1.702 m)   Wt 72.6 kg   LMP 06/01/2018   SpO2 98%   BMI 25.06 kg/m   Physical Exam Vitals signs and nursing note reviewed.  Constitutional:      Appearance: She is well-developed.  HENT:     Head: Normocephalic and atraumatic.  Eyes:     General:        Right eye: No discharge.        Left eye: No discharge.     Conjunctiva/sclera: Conjunctivae normal.  Neck:     Musculoskeletal: Normal range of motion and neck supple.  Cardiovascular:     Rate and Rhythm: Regular rhythm. Tachycardia present.     Heart sounds: Normal heart sounds.  Pulmonary:     Effort: Pulmonary effort is normal.     Breath sounds: Normal breath sounds.  Abdominal:     Palpations: Abdomen is soft.     Tenderness: There is generalized abdominal tenderness (mild-moderate). There is no guarding or rebound. Negative signs include Murphy's sign, Rovsing's sign, McBurney's sign, psoas sign and obturator sign.     Hernia: No hernia is present.     Comments: Patient winces with light and deep palpation in all 4 quadrants of the abdomen.  Skin:    General: Skin is warm and dry.  Neurological:     Mental Status: She is alert.      ED Treatments / Results  Labs (all labs ordered are listed, but only abnormal results are displayed) Labs Reviewed  URINALYSIS, ROUTINE W REFLEX MICROSCOPIC - Abnormal; Notable for the following components:      Result Value   Hgb urine dipstick LARGE (*)    All other components within normal limits  CBC WITH DIFFERENTIAL/PLATELET - Abnormal; Notable for the following components:   WBC 17.2 (*)    Neutro Abs 15.7 (*)    Lymphs Abs 0.5 (*)    Abs Immature Granulocytes 0.09 (*)    All other components within normal limits  COMPREHENSIVE METABOLIC PANEL - Abnormal; Notable for the following components:   Glucose, Bld 112 (*)    BUN 23 (*)    Calcium 8.6 (*)    All other components within normal  limits  URINALYSIS, MICROSCOPIC (REFLEX) - Abnormal; Notable for the following components:   Bacteria, UA FEW (*)    All other components within normal limits  PREGNANCY, URINE  LIPASE, BLOOD    EKG None  Radiology Ct Abdomen Pelvis W Contrast  Result Date: 06/08/2018 CLINICAL DATA:  Onset vomiting and abdominal pain this morning. EXAM: CT ABDOMEN AND PELVIS WITH CONTRAST TECHNIQUE: Multidetector CT imaging of the abdomen and  pelvis was performed using the standard protocol following bolus administration of intravenous contrast. CONTRAST:  100 mL ISOVUE-300 IOPAMIDOL (ISOVUE-300) INJECTION 61% COMPARISON:  CT abdomen and pelvis 02/23/2017. FINDINGS: Lower chest: Mild dependent atelectasis is seen in the left lung base. Lung bases otherwise clear. No pleural or pericardial effusion. Heart size is normal. Hepatobiliary: No focal liver abnormality is seen. Status post cholecystectomy. No biliary dilatation. Pancreas: Unremarkable. No pancreatic ductal dilatation or surrounding inflammatory changes. Spleen: Normal in size without focal abnormality. Adrenals/Urinary Tract: Adrenal glands are unremarkable. Kidneys are normal, without renal calculi, focal lesion, or hydronephrosis. Bladder is unremarkable. Stomach/Bowel: Stomach is within normal limits. Appendix appears normal. No evidence of bowel wall thickening, distention, or inflammatory changes. Vascular/Lymphatic: No significant vascular findings are present. No enlarged abdominal or pelvic lymph nodes. Reproductive: Uterus and bilateral adnexa are unremarkable. Other: Small fat containing umbilical hernia noted. Musculoskeletal: No acute abnormality or worrisome lesion. Degenerative disease about the SI joints which appears advanced for age is worse on the right and unchanged. IMPRESSION: No acute abnormality or finding to explain the patient's symptoms. Small fat containing umbilical hernia. Status post cholecystectomy. Electronically Signed   By:  Drusilla Kannerhomas  Dalessio M.D.   On: 06/08/2018 13:53    Procedures Procedures (including critical care time)  Medications Ordered in ED Medications  metoCLOPramide (REGLAN) injection 10 mg (has no administration in time range)  sodium chloride 0.9 % bolus 1,000 mL (0 mLs Intravenous Stopped 06/08/18 1129)  ondansetron (ZOFRAN) injection 4 mg (4 mg Intravenous Given 06/08/18 1037)  sodium chloride 0.9 % bolus 1,000 mL (0 mLs Intravenous Stopped 06/08/18 1343)  iopamidol (ISOVUE-300) 61 % injection 100 mL (100 mLs Intravenous Contrast Given 06/08/18 1335)  ondansetron (ZOFRAN) injection 4 mg (4 mg Intravenous Given 06/08/18 1402)     Initial Impression / Assessment and Plan / ED Course  I have reviewed the triage vital signs and the nursing notes.  Pertinent labs & imaging results that were available during my care of the patient were reviewed by me and considered in my medical decision making (see chart for details).     Patient seen and examined. Work-up initiated. Medications ordered.  Tachycardic on arrival, will treat with fluids.  On exam, she has no focality and seems to be tender in all 4 quadrants including the upper abdomen.  No vaginal symptoms reported by patient.  Vital signs reviewed and are as follows: BP 120/85 (BP Location: Left Arm)   Pulse (!) 118   Temp 98.5 F (36.9 C) (Oral)   Resp 18   Ht 5\' 7"  (1.702 m)   Wt 72.6 kg   LMP 06/01/2018   SpO2 98%   BMI 25.06 kg/m   12:35 PM on reexam, patient has pain that is more focal in the right lower quadrant.  She no longer has any wincing to palpation in the upper abdomen.  Given leukocytosis and focal right lower quadrant pain, feel that CT imaging is indicated.  Patient in agreement.  This is ordered.  Awaiting urine testing.  Will give additional fluids.  2:45 PM CT reassuring. Pt continues to have vomiting. Reglan ordered.   4:57 PM patient feeling better after additional treatments.  Ready for discharged home at this time.  Home with rx Reglan.   The patient was urged to return to the Emergency Department immediately with worsening of current symptoms, worsening abdominal pain, persistent vomiting, blood noted in stools, fever, or any other concerns. The patient verbalized understanding.    Final  Clinical Impressions(s) / ED Diagnoses   Final diagnoses:  Non-intractable vomiting with nausea, unspecified vomiting type  RLQ abdominal pain   Patient with abdominal pain.  Localizes to the right lower quadrant.  Vitals are stable, no fever. Labs with elevated white count. Imaging CT imaging does not demonstrate appendicitis, signs of torsion or other pelvic pathology.  Suspect gastroenteritis given constellation of symptoms.  Patient tolerating p.o.'s after treatment in the emergency department. Lungs are clear and no signs suggestive of PNA. Low concern for appendicitis, cholecystitis, pancreatitis, ruptured viscus, UTI, kidney stone, aortic dissection, aortic aneurysm or other emergent abdominal etiology. Supportive therapy indicated with return if symptoms worsen.    ED Discharge Orders         Ordered    metoCLOPramide (REGLAN) 10 MG tablet  4 times daily     06/08/18 1653           Renne Crigler, PA-C 06/08/18 1658    Terrilee Files, MD 06/08/18 1730

## 2018-06-08 NOTE — ED Notes (Signed)
ED Provider at bedside. 

## 2018-06-08 NOTE — ED Notes (Signed)
NAD at this time. Pt is stable and going home.  

## 2018-06-08 NOTE — ED Triage Notes (Signed)
Woke up at 0600 with abd pain, cramping and n/v. Denies diarrhea

## 2018-06-08 NOTE — ED Notes (Signed)
Pt unable to void at this time. Specimen cup given. 

## 2018-06-08 NOTE — ED Notes (Signed)
CT waiting for results of pregnancy test prior to exam

## 2018-06-08 NOTE — ED Notes (Signed)
Called x 1 no answer

## 2018-12-14 ENCOUNTER — Telehealth: Payer: Self-pay | Admitting: Advanced Practice Midwife

## 2018-12-14 NOTE — Telephone Encounter (Signed)
Pt states that she is having tenderness around where the nexplanon is and is wanting to have it taken out and change birth controls.

## 2018-12-14 NOTE — Telephone Encounter (Signed)
Pt wants to have Nexplanon removed. Having tenderness around site and cramping with Nexplanon. Pt wants to switch to a pill or patch. Call transferred to front desk for appt. Beth Key

## 2018-12-25 ENCOUNTER — Telehealth: Payer: Self-pay | Admitting: Advanced Practice Midwife

## 2018-12-25 NOTE — Telephone Encounter (Signed)
Unable to reach with restrictions.

## 2018-12-26 ENCOUNTER — Encounter: Payer: Medicaid Other | Admitting: Advanced Practice Midwife

## 2019-02-11 ENCOUNTER — Other Ambulatory Visit: Payer: Self-pay

## 2019-02-11 ENCOUNTER — Ambulatory Visit (INDEPENDENT_AMBULATORY_CARE_PROVIDER_SITE_OTHER): Payer: Medicaid Other | Admitting: Women's Health

## 2019-02-11 ENCOUNTER — Encounter: Payer: Self-pay | Admitting: Women's Health

## 2019-02-11 VITALS — BP 122/82 | HR 92 | Ht 67.5 in | Wt 217.5 lb

## 2019-02-11 DIAGNOSIS — Z3046 Encounter for surveillance of implantable subdermal contraceptive: Secondary | ICD-10-CM

## 2019-02-11 DIAGNOSIS — Z30015 Encounter for initial prescription of vaginal ring hormonal contraceptive: Secondary | ICD-10-CM

## 2019-02-11 DIAGNOSIS — Z113 Encounter for screening for infections with a predominantly sexual mode of transmission: Secondary | ICD-10-CM

## 2019-02-11 DIAGNOSIS — Z3202 Encounter for pregnancy test, result negative: Secondary | ICD-10-CM

## 2019-02-11 LAB — POCT URINE PREGNANCY: Preg Test, Ur: NEGATIVE

## 2019-02-11 MED ORDER — ETONOGESTREL-ETHINYL ESTRADIOL 0.12-0.015 MG/24HR VA RING: VAGINAL_RING | VAGINAL | 3 refills | Status: DC

## 2019-02-11 NOTE — Progress Notes (Signed)
   [facility] REMOVAL Patient name: Beth Key MRN [id number]  Date of birth: [birth year] Subjective Findings:   Beth Key is a 18 y.o. G35P1001 Caucasian female being seen today for removal of a Nexplanon. Her Nexplanon was placed 04/12/18.  She desires removal because of weight gain and worsening depression. Signed copy of informed consent in chart.   Patient's last menstrual period was 02/04/2019. Last pap<21yo. Results were:  n/a The planned method of family planning is NuvaRing vaginal inserts. Does smoke. No h/o HTN, DVT/PE, CVA, MI, or migraines w/ aura.  Pertinent History Reviewed:   Reviewed past medical,surgical, social, obstetrical and family history.  Reviewed problem list, medications and allergies. Objective Findings & Procedure:    Vitals:   02/11/19 1416  BP: 122/82  Pulse: 92  Weight: 217 lb 8 oz (98.7 kg)  Height: 5' 7.5" (1.715 m)  Body mass index is 33.56 kg/m.  Results for orders placed or performed in visit on 02/11/19 (from the past 24 hour(s))  POCT urine pregnancy   Collection Time: 02/11/19  2:19 PM  Result Value Ref Range   Preg Test, Ur Negative Negative     Time out was performed.  Nexplanon site identified.  Area prepped in usual sterile fashon. One cc of 2% lidocaine was used to anesthetize the area at the distal end of the implant. A small stab incision was made right beside the implant on the distal portion.  The Nexplanon rod was grasped using hemostats and removed without difficulty.  There was less than 3 cc blood loss. There were no complications.  Steri-strips were applied over the small incision and a pressure bandage was applied.  The patient tolerated the procedure well. Assessment & Plan:   1) Nexplanon removal She was instructed to keep the area clean and dry, remove pressure bandage in 24 hours, and keep insertion site covered with the steri-strip for 3-5 days.   Follow-up PRN problems.  2) Contraception management> rx nuvaring,  condoms x 2wks, f/u 15mths  3) STD screen  4) Smoker> advised cessation  Orders Placed This Encounter  Procedures  . GC/Chlamydia Probe Amp  . POCT urine pregnancy    Follow-up: Return in about 3 months (around 05/13/2019) for F/U.  [place], [facility] 02/11/2019 2:54 PM

## 2019-02-11 NOTE — Patient Instructions (Signed)
Condoms x 2wks  Keep the area clean and dry.  You can remove the big bandage in 24 hours, and the small steri-strip bandage in 3-5 days.    Ethinyl Estradiol; Etonogestrel vaginal ring What is this medicine? ETHINYL ESTRADIOL; ETONOGESTREL (ETH in il es tra DYE ole; et oh noe JES trel) vaginal ring is a flexible, vaginal ring used as a contraceptive (birth control method). This medicine combines 2 types of female hormones, an estrogen and a progestin. This ring is used to prevent ovulation and pregnancy. Each ring is effective for 1 month. This medicine may be used for other purposes; ask your health care provider or pharmacist if you have questions. COMMON BRAND NAME(S): EluRyng, NuvaRing What should I tell my health care provider before I take this medicine? They need to know if you have any of these conditions:  abnormal vaginal bleeding  blood vessel disease or blood clots  breast, cervical, endometrial, ovarian, liver, or uterine cancer  diabetes  gallbladder disease  having surgery  heart disease or recent heart attack  high blood pressure  high cholesterol or triglycerides  history of irregular heartbeat or heart valve problems  kidney disease  liver disease  migraine headaches  protein C deficiency  protein S deficiency  recently had a baby, miscarriage, or abortion  stroke  systemic lupus erythematosus (SLE)  tobacco smoker  your age is more than 20 years old  an unusual or allergic reaction to estrogens, progestins, other medicines, foods, dyes, or preservatives  pregnant or trying to get pregnant  breast-feeding How should I use this medicine? Insert the ring into your vagina as directed. Follow the directions on the prescription label. The ring will remain place for 3 weeks and is then removed for a 1-week break. A new ring is inserted 1 week after the last ring was removed, on the same day of the week. Check often to make sure the ring is still  in place. If the ring was out of the vagina for an unknown amount of time, you may not be protected from pregnancy. Perform a pregnancy test and call your doctor. Do not use more often than directed. A patient package insert for the product will be given with each prescription and refill. Read this sheet carefully each time. The sheet may change frequently. Contact your pediatrician regarding the use of this medicine in children. Special care may be needed. Overdosage: If you think you have taken too much of this medicine contact a poison control center or emergency room at once. NOTE: This medicine is only for you. Do not share this medicine with others. What if I miss a dose? You will need to use the ring exactly as directed. It is very important to follow the schedule every cycle. If you do not use the ring as directed, you may not be protected from pregnancy. If the ring should slip out, is lost, or if you leave it in longer or shorter than you should, contact your health care professional for advice. What may interact with this medicine? Do not take this medicine with the following medications:  dasabuvir; ombitasvir; paritaprevir; ritonavir  ombitasvir; paritaprevir; ritonavir  vaginal lubricants or other vaginal products that are oil-based or silicone-based This medicine may also interact with the following medications:  acetaminophen  antibiotics or medicines for infections, especially rifampin, rifabutin, rifapentine, and griseofulvin, and possibly penicillins or tetracyclines  aprepitant or fosaprepitant  armodafinil  ascorbic acid (vitamin C)  barbiturate medicines, such as phenobarbital  or primidone  bosentan  certain antiviral medicines for hepatitis, HIV or AIDS  certain medicines for cancer treatment  certain medicines for seizures like carbamazepine, clobazam, felbamate, lamotrigine, oxcarbazepine, phenytoin, rufinamide, topiramate  certain medicines for treating  high cholesterol  cyclosporine  dantrolene  elagolix  flibanserin  grapefruit juice  lesinurad  medicines for diabetes  medicines to treat fungal infections, such as griseofulvin, miconazole, fluconazole, ketoconazole, itraconazole, posaconazole or voriconazole  mifepristone  mitotane  modafinil  morphine  mycophenolate  St. John's wort  tamoxifen  temazepam  theophylline or aminophylline  thyroid hormones  tizanidine  tranexamic acid  ulipristal  warfarin This list may not describe all possible interactions. Give your health care provider a list of all the medicines, herbs, non-prescription drugs, or dietary supplements you use. Also tell them if you smoke, drink alcohol, or use illegal drugs. Some items may interact with your medicine. What should I watch for while using this medicine? Visit your doctor or health care professional for regular checks on your progress. You will need a regular breast and pelvic exam and Pap smear while on this medicine. Check with your doctor or health care professional to see if you need an additional method of contraception during the first cycle that you use this ring. Female condoms (made with natural rubber latex, polyisoprene, and polyurethane) and spermicides may be used. Do not use a diaphragm, cervical cap, or a female condom, as the ring can interfere with these birth control methods and their proper placement. If you have any reason to think you are pregnant, stop using this medicine right away and contact your doctor or health care professional. If you are using this medicine for hormone related problems, it may take several cycles of use to see improvement in your condition. Smoking increases the risk of getting a blood clot or having a stroke while you are using hormonal birth control, especially if you are more than 19 years old. You are strongly advised not to smoke. Some women are prone to getting dark patches on the  skin of the face (cholasma). Your risk of getting chloasma with this medicine is higher if you had chloasma during a pregnancy. Keep out of the sun. If you cannot avoid being in the sun, wear protective clothing and use sunscreen. Do not use sun lamps or tanning beds/booths. This medicine can make your body retain fluid, making your fingers, hands, or ankles swell. Your blood pressure can go up. Contact your doctor or health care professional if you feel you are retaining fluid. If you are going to have elective surgery, you may need to stop using this medicine before the surgery. Consult your health care professional for advice. This medicine does not protect you against HIV infection (AIDS) or any other sexually transmitted diseases. What side effects may I notice from receiving this medicine? Side effects that you should report to your doctor or health care professional as soon as possible:  allergic reactions such as skin rash or itching, hives, swelling of the lips, mouth, tongue, or throat  depression  high blood pressure  migraines or severe, sudden headaches  signs and symptoms of a blood clot such as breathing problems; changes in vision; chest pain; severe, sudden headache; pain, swelling, warmth in the leg; trouble speaking; sudden numbness or weakness of the face, arm or leg  signs and symptoms of infection like fever or chills with dizziness and a sunburn-like rash, or pain or trouble passing urine  stomach pain  symptoms of vaginal infection like itching, irritation or unusual discharge  yellowing of the eyes or skin Side effects that usually do not require medical attention (report these to your doctor or health care professional if they continue or are bothersome):  acne  breast pain, tenderness  irregular vaginal bleeding or spotting, particularly during the first month of use  mild headache  nausea  painful periods  vomiting This list may not describe all  possible side effects. Call your doctor for medical advice about side effects. You may report side effects to FDA at 1-800-FDA-1088. Where should I keep my medicine? Keep out of the reach of children. Store unopened rings in the original foil pouch at room temperature between 20 and 25 degrees C (68 and 77 degrees F) for up to 4 months. Protect from light. Do not store above 30 degrees C (86 degrees F). Throw away any unused medicine after the expiration date. A ring may only be used for 1 cycle (1 month). After the 3-week cycle, a used ring is removed and should be placed in the re-closable foil pouch and discarded in the trash out of reach of children and pets. Do NOT flush down the toilet. NOTE: This sheet is a summary. It may not cover all possible information. If you have questions about this medicine, talk to your doctor, pharmacist, or health care provider.  2020 Elsevier/Gold Standard (2017-01-06 14:41:10)

## 2019-04-11 ENCOUNTER — Other Ambulatory Visit: Payer: Self-pay

## 2019-04-11 DIAGNOSIS — Z20822 Contact with and (suspected) exposure to covid-19: Secondary | ICD-10-CM

## 2019-04-14 LAB — NOVEL CORONAVIRUS, NAA: SARS-CoV-2, NAA: DETECTED — AB

## 2019-05-13 ENCOUNTER — Ambulatory Visit: Payer: Medicaid Other | Admitting: Women's Health

## 2019-07-08 ENCOUNTER — Emergency Department (HOSPITAL_BASED_OUTPATIENT_CLINIC_OR_DEPARTMENT_OTHER)
Admission: EM | Admit: 2019-07-08 | Discharge: 2019-07-08 | Disposition: A | Discharge status: Home / Self Care | Payer: Medicaid Other | Attending: Emergency Medicine | Admitting: Emergency Medicine

## 2019-07-08 ENCOUNTER — Other Ambulatory Visit: Payer: Self-pay

## 2019-07-08 ENCOUNTER — Encounter (HOSPITAL_BASED_OUTPATIENT_CLINIC_OR_DEPARTMENT_OTHER): Payer: Self-pay

## 2019-07-08 DIAGNOSIS — S161XXA Strain of muscle, fascia and tendon at neck level, initial encounter: Secondary | ICD-10-CM | POA: Insufficient documentation

## 2019-07-08 DIAGNOSIS — Y999 Unspecified external cause status: Secondary | ICD-10-CM | POA: Insufficient documentation

## 2019-07-08 DIAGNOSIS — Y939 Activity, unspecified: Secondary | ICD-10-CM | POA: Diagnosis not present

## 2019-07-08 DIAGNOSIS — Y92002 Bathroom of unspecified non-institutional (private) residence single-family (private) house as the place of occurrence of the external cause: Secondary | ICD-10-CM | POA: Insufficient documentation

## 2019-07-08 DIAGNOSIS — S199XXA Unspecified injury of neck, initial encounter: Secondary | ICD-10-CM | POA: Diagnosis present

## 2019-07-08 DIAGNOSIS — J45909 Unspecified asthma, uncomplicated: Secondary | ICD-10-CM | POA: Insufficient documentation

## 2019-07-08 DIAGNOSIS — W182XXA Fall in (into) shower or empty bathtub, initial encounter: Secondary | ICD-10-CM | POA: Diagnosis not present

## 2019-07-08 DIAGNOSIS — F17290 Nicotine dependence, other tobacco product, uncomplicated: Secondary | ICD-10-CM | POA: Insufficient documentation

## 2019-07-08 MED ORDER — NAPROXEN 500 MG PO TABS: 500.0000 mg | ORAL_TABLET | Freq: Two times a day (BID) | ORAL | 0 refills | Status: DC | PRN

## 2019-07-08 MED ORDER — CYCLOBENZAPRINE HCL 10 MG PO TABS: 10.0000 mg | ORAL_TABLET | Freq: Two times a day (BID) | ORAL | 0 refills | Status: DC | PRN

## 2019-07-08 MED FILL — CYCLOBENZAPRINE HCL 10 MG T: 10 | 8 days supply | Qty: 16 | Fill #0

## 2019-07-08 MED FILL — NAPROXEN 500 MG TABS: 500 | 15 days supply | Qty: 30 | Fill #0

## 2019-07-08 NOTE — ED Triage Notes (Signed)
Pt arrives ambulatory to ED with reports of left sided neck and shoulder pain. Pt had a recent fall in the shower about 4 days ago but reports that the pain really started to get bad last night with on episode of vomiting r/t pain.

## 2019-07-08 NOTE — Discharge Instructions (Signed)
Please take the medications, as prescribed.  Discontinue all her medications.  Continue heat pads to the affected area.  You were given a prescription for Flexeril which is a muscle relaxer.  You should not drive, work, consume alcohol, or operate machinery while taking this medication as it can make you very drowsy.  You may require additional support with your 82.21-year-old son, particularly while on this medication.  It is important that you rest to ensure relief of your suspected spasm/strain.  Should your symptoms are improved with rest and appropriate medications, I encourage you to follow-up with orthopedist that I have listed.  Please also get established with a primary care provider soon as possible for ongoing evaluation management of your health and wellbeing.  Return to the ED or seek immediate medical attention for any new or worsening symptoms.

## 2019-07-08 NOTE — ED Provider Notes (Signed)
[facility] EMERGENCY DEPARTMENT Provider Note   CSN: [id number] Arrival date & time: 07/08/19  0944     History Chief Complaint  Patient presents with  . Neck Pain    Beth Key is a 19 y.o. female with no relevant PMH presents to the ED with left-sided neck and trapezial discomfort.  Patient reports that she is a mother to a 48.44-year-old son.  She states that she fell in the bathtub after slipping on baby oil however denies any head injury, neck injury, loss of consciousness, or significant injury.  However, 2 days later she developed left-sided neck stiffness and discomfort, particularly with any ROM, and she has been treating at home with NSAIDs, Tylenol, ice, and heat.  However, she has not had any relief of symptoms which prompted her to come to the ED for evaluation.  She had one episode of nausea and vomiting which she attributes to her discomfort.  She denies any fevers or chills, recent illness, visual changes, numbness or weakness, radicular symptoms, headache or dizziness, difficulty eating or drinking, ear pain, chest pain or shortness of breath, or other symptoms.  Patient also denies any personal history of malignancy, IVDA, alcohol use, fevers or chills, recent illness, pain worse at night, history of clots, or other concerning history.  HPI     Past Medical History:  Diagnosis Date  . Acute pancreatitis 10/08/2013  . ADHD (attention deficit hyperactivity disorder)   . Anxiety   . Asthma    Last asthmatic episode approx. 1 yr ago  . Eating disorder    Pt states that she is anorexic and bulemic; MDs made aware of pt. self-diagnosis  . Gallstones 10/15/2013  . Headache(784.0)   . Symptomatic cholelithiasis 12/31/2013    Patient Active Problem List   Diagnosis Date Noted  . Drug use affecting pregnancy 06/02/2017  . Gastroesophageal reflux disease without esophagitis 10/30/2015  . Panic disorder 07/15/2015  . Substance abuse (HCC) 09/18/2014  .  Nicotine abuse 10/15/2013  . 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 Disordered eating 10/15/2013  . Menorrhagia with irregular cycle 10/08/2013    Past Surgical History:  Procedure Laterality Date  . CHOLECYSTECTOMY N/A 12/31/2013   Procedure: LAPAROSCOPIC CHOLECYSTECTOMY ;  Surgeon: Judie Petit. Leonia Corona, MD;  Location: [facility] OR;  Service: Pediatrics;  Laterality: N/A;     OB History    Gravida  1   Para  1   Term  1   Preterm      AB      Living  1     SAB      TAB      Ectopic      Multiple  0   Live Births  1           Family History  Adopted: Yes  Problem Relation Age of Onset  . Alcohol abuse Mother   . Depression Mother   . Drug abuse Mother   . Heart disease Maternal Grandmother   . Hypertension Maternal Grandmother   . Depression Maternal Grandmother   . Heart disease Maternal Aunt   . Hypertension Maternal Aunt   . Depression Maternal Aunt     Social History   Tobacco Use  . Smoking status: Current Every Day Smoker    Types: E-cigarettes    Last attempt to quit: 07/2017    Years since quitting: 1.9  . Smokeless tobacco: Never Used  Substance Use Topics  . Alcohol use: Not Currently  Alcohol/week: 0.0 standard drinks  . Drug use: Yes    Types: Marijuana    Comment: before pregnancy    Home Medications Prior to Admission medications   Medication Sig Start Date End Date Taking? Authorizing Provider  cyclobenzaprine (FLEXERIL) 10 MG tablet Take 1 tablet (10 mg total) by mouth 2 (two) times daily as needed for muscle spasms. 07/08/19   Corena Herter, PA-C  etonogestrel-ethinyl estradiol (NUVARING) 0.12-0.015 MG/24HR vaginal ring Insert vaginally and leave in place for 3 consecutive weeks, then remove for 1 week. 02/11/19   Roma Schanz, CNM  naproxen (NAPROSYN) 500 MG tablet Take 1 tablet (500 mg total) by mouth 2 (two) times daily as needed for moderate pain. 07/08/19   Corena Herter, PA-C     Allergies    Penicillins and Promethazine  Review of Systems   Review of Systems  Constitutional: Negative for chills and fever.  Eyes: Negative for photophobia, pain and visual disturbance.  Musculoskeletal: Positive for neck pain and neck stiffness.  Neurological: Negative for dizziness, weakness, numbness and headaches.    Physical Exam Updated Vital Signs BP 108/79 (BP Location: Right Arm)   Pulse 81   Temp 98.1 F (36.7 C) (Oral)   Resp 18   Ht 5\' 8"  (1.727 m)   Wt 90.7 kg   LMP 07/04/2019   SpO2 98%   BMI 30.41 kg/m   Physical Exam Vitals and nursing note reviewed. Exam conducted with a chaperone present.  Constitutional:      General: She is not in acute distress.    Appearance: Normal appearance. She is not ill-appearing.  HENT:     Head: Normocephalic and atraumatic.  Eyes:     General: No scleral icterus.    Extraocular Movements: Extraocular movements intact.     Conjunctiva/sclera: Conjunctivae normal.     Pupils: Pupils are equal, round, and reactive to light.  Neck:     Comments: Patient able to demonstrate full ROM, however with difficulty.  Mild TTP over left-sided trapezius, however painful regardless of palpation.  Pain with ROM.  No cervical midline TTP.  No overlying skin changes.  No gross deformity.  No swelling. Cardiovascular:     Rate and Rhythm: Normal rate and regular rhythm.     Pulses: Normal pulses.     Heart sounds: Normal heart sounds.  Pulmonary:     Effort: Pulmonary effort is normal. No respiratory distress.     Breath sounds: Normal breath sounds. No wheezing or rales.  Abdominal:     General: Abdomen is flat. There is no distension.     Palpations: Abdomen is soft.     Tenderness: There is no abdominal tenderness. There is no guarding.  Musculoskeletal:     Comments: No clavicular or shoulder TTP.  Left arm: Full ROM intact.  However, left sided trapezial discomfort with left arm abduction.  No radicular pain with ROM.   Sensation intact.  Pulses intact.  Strength intact.  Skin:    General: Skin is dry.  Neurological:     Mental Status: She is alert.     GCS: GCS eye subscore is 4. GCS verbal subscore is 5. GCS motor subscore is 6.     Comments: CN II through XII grossly intact.  Negative Romberg and cerebellar exams.  PERRL and EOM intact.  No diminished visual acuity.  Sensation and strength intact throughout.  Psychiatric:        Mood and Affect: Mood normal.  Behavior: Behavior normal.        Thought Content: Thought content normal.     ED Results / Procedures / Treatments   Labs (all labs ordered are listed, but only abnormal results are displayed) Labs Reviewed - No data to display  EKG None  Radiology No results found.  Procedures Procedures (including critical care time)  Medications Ordered in ED Medications - No data to display  ED Course  I have reviewed the triage vital signs and the nursing notes.  Pertinent labs & imaging results that were available during my care of the patient were reviewed by me and considered in my medical decision making (see chart for details).    MDM Rules/Calculators/A&P                      Patient's history and physical exam is consistent with musculoskeletal etiology, likely a left-sided trapezial strain.  She is denying any neurologic or cardiac symptoms.  She has no significant past medical history and is in no acute distress on my physical examination.  No meningismus.  She denies any fevers, chills, IVDA, personal history of malignancy, or any other concerning history.  Her physical exam is largely reassuring and there is no bony tenderness to palpation.  Do not feel as though plain films warranted at this time.   She reports that she has continued to take care of her 40 lb son, albeit physically demanding, which she believes is contributing to her discomfort.  Advised her to rest and avoid heavy lifting.  We will treat with naproxen and  Flexeril.  Cautioned her on the side effects of Flexeril.  Will refer to orthopedics should she continue experience ongoing pain discomfort despite rest and appropriate medications.  Strict ED return precautions discussed.  Patient voiced understanding and is agreeable to plan.  Final Clinical Impression(s) / ED Diagnoses Final diagnoses:  Acute strain of neck muscle, initial encounter    Rx / DC Orders ED Discharge Orders         Ordered    naproxen (NAPROSYN) 500 MG tablet  2 times daily PRN     07/08/19 1050    cyclobenzaprine (FLEXERIL) 10 MG tablet  2 times daily PRN     07/08/19 1050           Elvera Maria 07/08/19 1050    Raeford Razor, MD 07/08/19 1428

## 2019-07-08 NOTE — ED Notes (Signed)
Pt has been using heat and ice at home as well as icy hot, tylenol, and ibuprofen with no relief.

## 2019-07-08 NOTE — ED Notes (Signed)
ED Provider at bedside. 

## 2020-06-26 ENCOUNTER — Emergency Department (HOSPITAL_COMMUNITY): Payer: Medicaid Other

## 2020-06-26 ENCOUNTER — Emergency Department (HOSPITAL_COMMUNITY)
Admission: EM | Admit: 2020-06-26 | Discharge: 2020-06-26 | Disposition: A | Discharge status: Home / Self Care | Payer: Medicaid Other | Attending: Emergency Medicine | Admitting: Emergency Medicine

## 2020-06-26 ENCOUNTER — Other Ambulatory Visit: Payer: Self-pay

## 2020-06-26 ENCOUNTER — Encounter (HOSPITAL_COMMUNITY): Payer: Self-pay | Admitting: Emergency Medicine

## 2020-06-26 DIAGNOSIS — G935 Compression of brain: Secondary | ICD-10-CM | POA: Diagnosis not present

## 2020-06-26 DIAGNOSIS — J45909 Unspecified asthma, uncomplicated: Secondary | ICD-10-CM | POA: Diagnosis not present

## 2020-06-26 DIAGNOSIS — U071 COVID-19: Secondary | ICD-10-CM | POA: Diagnosis not present

## 2020-06-26 DIAGNOSIS — F17290 Nicotine dependence, other tobacco product, uncomplicated: Secondary | ICD-10-CM | POA: Insufficient documentation

## 2020-06-26 DIAGNOSIS — R519 Headache, unspecified: Secondary | ICD-10-CM

## 2020-06-26 LAB — COMPREHENSIVE METABOLIC PANEL
ALT: 17 U/L (ref 0–44)
AST: 15 U/L (ref 15–41)
Albumin: 4.6 g/dL (ref 3.5–5.0)
Alkaline Phosphatase: 70 U/L (ref 38–126)
Anion gap: 11 (ref 5–15)
BUN: 9 mg/dL (ref 6–20)
CO2: 24 mmol/L (ref 22–32)
Calcium: 9.1 mg/dL (ref 8.9–10.3)
Chloride: 103 mmol/L (ref 98–111)
Creatinine, Ser: 0.71 mg/dL (ref 0.44–1.00)
GFR, Estimated: >60 mL/min (ref >60)
Glucose, Bld: 94 mg/dL (ref 70–99)
Potassium: 3.4 mmol/L — ABNORMAL LOW (ref 3.5–5.1)
Sodium: 138 mmol/L (ref 135–145)
Total Bilirubin: 0.7 mg/dL (ref 0.3–1.2)
Total Protein: 7.6 g/dL (ref 6.5–8.1)

## 2020-06-26 LAB — CBC WITH DIFFERENTIAL/PLATELET
Abs Immature Granulocytes: 0.03 10*3/uL (ref 0.00–0.07)
Basophils Absolute: 0 10*3/uL (ref 0.0–0.1)
Basophils Relative: 0 %
Eosinophils Absolute: 0 10*3/uL (ref 0.0–0.5)
Eosinophils Relative: 0 %
HCT: 38.6 % (ref 36.0–46.0)
Hemoglobin: 12.2 g/dL (ref 12.0–15.0)
Immature Granulocytes: 0 %
Lymphocytes Relative: 7 %
Lymphs Abs: 0.5 10*3/uL — ABNORMAL LOW (ref 0.7–4.0)
MCH: 25.2 pg — ABNORMAL LOW (ref 26.0–34.0)
MCHC: 31.6 g/dL (ref 30.0–36.0)
MCV: 79.8 fL — ABNORMAL LOW (ref 80.0–100.0)
Monocytes Absolute: 0.6 10*3/uL (ref 0.1–1.0)
Monocytes Relative: 8 %
Neutro Abs: 6.3 10*3/uL (ref 1.7–7.7)
Neutrophils Relative %: 85 %
Platelets: 325 10*3/uL (ref 150–400)
RBC: 4.84 MIL/uL (ref 3.87–5.11)
RDW: 15.5 % (ref 11.5–15.5)
WBC: 7.5 10*3/uL (ref 4.0–10.5)
nRBC: 0 % (ref 0.0–0.2)

## 2020-06-26 LAB — PREGNANCY, URINE: Preg Test, Ur: NEGATIVE

## 2020-06-26 MED ORDER — FENTANYL CITRATE (PF) 100 MCG/2ML IJ SOLN: 100.0000 ug | Freq: Once | INTRAMUSCULAR | Status: DC

## 2020-06-26 MED ORDER — FENTANYL CITRATE (PF) 100 MCG/2ML IJ SOLN
100.0000 ug | Freq: Once | INTRAMUSCULAR | Status: AC
  Administered 2020-06-26: 100 ug via INTRAVENOUS
  Filled 2020-06-26: qty 2

## 2020-06-26 MED ORDER — KETOROLAC TROMETHAMINE 30 MG/ML IJ SOLN
30.0000 mg | Freq: Once | INTRAMUSCULAR | Status: AC
  Administered 2020-06-26: 30 mg via INTRAVENOUS
  Filled 2020-06-26: qty 1

## 2020-06-26 MED ORDER — PROMETHAZINE HCL 25 MG/ML IJ SOLN: 12.5000 mg | Freq: Once | INTRAMUSCULAR | Status: DC

## 2020-06-26 MED ORDER — METOCLOPRAMIDE HCL 5 MG/ML IJ SOLN
10.0000 mg | Freq: Once | INTRAMUSCULAR | Status: AC
  Administered 2020-06-26: 10 mg via INTRAVENOUS
  Filled 2020-06-26: qty 2

## 2020-06-26 MED ORDER — DEXAMETHASONE SODIUM PHOSPHATE 10 MG/ML IJ SOLN
10.0000 mg | Freq: Once | INTRAMUSCULAR | Status: AC
  Administered 2020-06-26: 10 mg via INTRAVENOUS
  Filled 2020-06-26: qty 1

## 2020-06-26 MED ORDER — IBUPROFEN 800 MG PO TABS: 800.0000 mg | ORAL_TABLET | Freq: Three times a day (TID) | ORAL | 0 refills | Status: AC

## 2020-06-26 MED ORDER — DIPHENHYDRAMINE HCL 50 MG/ML IJ SOLN
25.0000 mg | Freq: Once | INTRAMUSCULAR | Status: AC
  Administered 2020-06-26: 25 mg via INTRAVENOUS
  Filled 2020-06-26: qty 1

## 2020-06-26 MED ORDER — ONDANSETRON HCL 4 MG/2ML IJ SOLN
4.0000 mg | Freq: Once | INTRAMUSCULAR | Status: AC
  Administered 2020-06-26: 4 mg via INTRAVENOUS
  Filled 2020-06-26: qty 2

## 2020-06-26 MED ORDER — HYDROMORPHONE HCL 1 MG/ML IJ SOLN: 1.0000 mg | Freq: Once | INTRAMUSCULAR | Status: DC

## 2020-06-26 MED ORDER — SODIUM CHLORIDE 0.9 % IV BOLUS
1000.0000 mL | Freq: Once | INTRAVENOUS | Status: AC
  Administered 2020-06-26: 1000 mL via INTRAVENOUS

## 2020-06-26 MED ORDER — FENTANYL CITRATE (PF) 100 MCG/2ML IJ SOLN: 50.0000 ug | Freq: Once | INTRAMUSCULAR | Status: DC

## 2020-06-26 NOTE — Progress Notes (Signed)
Failed attempt at MRI. Pt clearly states, "this medication is making me feel funny, I can't do this test". Pt refused MRI.

## 2020-06-26 NOTE — Discharge Instructions (Signed)
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  This is a condition where the brain extends into the spinal canal.  Please see the handout for more information about this condition.  I did speak to the neurosurgeon, who recommends taking 800 mg of ibuprofen up to 3 times a day as needed if you have a migraine.  Please make sure to follow-up with him in the office as he will be more equipped to help manage your pain.  Please return to the ER if you develop fevers, neck stiffness, severe pain.

## 2020-06-26 NOTE — ED Triage Notes (Signed)
Per EMS- patient reported that she was at [facility] ED and left due to "taking too long." patient called EMS while sitting on the side of a road. Patient c/o headache , fever, and emesis. Patient reports that the headache woke her up from sleeping this AM.

## 2020-06-26 NOTE — ED Notes (Addendum)
MRI attempted to transport pt to scan. MRI states pt became anxious, started crying and refused to do the scan; therefore, MRI transported pt back to ED.

## 2020-06-26 NOTE — ED Notes (Signed)
Patient transported to MRI 

## 2020-06-26 NOTE — ED Notes (Signed)
Pt returned from MRI °

## 2020-06-26 NOTE — ED Notes (Signed)
Patient transported to CT 

## 2020-06-26 NOTE — ED Provider Notes (Signed)
[facility]-EMERGENCY DEPT Provider Note   CSN: [id number] Arrival date & time: 06/26/20  1655     History Chief Complaint  Patient presents with  . Headache  . Fever  . Emesis    Beth Key is a 20 y.o. female.  HPI 20 year old female with history of ADHD, anxiety, asthma, eating disorder, GERD presents to the ER with complaints of severe headache.  States that she has been having a headache bruit over the last week, worsening over the last day.  She reports "inability to see", when further prompting she reports that she has severe pain when she opens her eyes.  She feels like her vision is blurry.  She denies any injuries or falls.  She feels like the pain is extending down into her neck.  Triage notes endorse fevers, however she denies this, she is afebrile here in the ER.  She did does not have any nasal congestion, cough, or any other infectious symptoms.  She has not taken anything for pain.  States that she was at [facility] ED, however due to long wait time she left, tried to drive herself here, but her pain was so severe she had to call EMS.  She denies any vomiting, endorses some nausea.  Denies any facial droop, unilateral weakness.  She refers that she does get occasional headaches, however this is different from her baseline headaches.  Past Medical History:  Diagnosis Date  . Acute pancreatitis 10/08/2013  . ADHD (attention deficit hyperactivity disorder)   . Anxiety   . Asthma    Last asthmatic episode approx. 1 yr ago  . Eating disorder    Pt states that she is anorexic and bulemic; MDs made aware of pt. self-diagnosis  . Gallstones 10/15/2013  . Headache(784.0)   . Symptomatic cholelithiasis 12/31/2013    Patient Active Problem List   Diagnosis Date Noted  . Drug use affecting pregnancy 06/02/2017  . Gastroesophageal reflux disease without esophagitis 10/30/2015  . Panic disorder 07/15/2015  . Substance abuse (HCC) 09/18/2014  .  Nicotine abuse 10/15/2013  . 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 Disordered eating 10/15/2013  . Menorrhagia with irregular cycle 10/08/2013    Past Surgical History:  Procedure Laterality Date  . CHOLECYSTECTOMY N/A 12/31/2013   Procedure: LAPAROSCOPIC CHOLECYSTECTOMY ;  Surgeon: Judie Petit. Leonia Corona, MD;  Location: [facility] OR;  Service: Pediatrics;  Laterality: N/A;     OB History    Gravida  1   Para  1   Term  1   Preterm      AB      Living  1     SAB      IAB      Ectopic      Multiple  0   Live Births  1           Family History  Adopted: Yes  Problem Relation Age of Onset  . Alcohol abuse Mother   . Depression Mother   . Drug abuse Mother   . Heart disease Maternal Grandmother   . Hypertension Maternal Grandmother   . Depression Maternal Grandmother   . Heart disease Maternal Aunt   . Hypertension Maternal Aunt   . Depression Maternal Aunt     Social History   Tobacco Use  . Smoking status: Current Every Day Smoker    Types: E-cigarettes    Last attempt to quit: 07/2017    Years since quitting: 2.9  .  Smokeless tobacco: Never Used  Vaping Use  . Vaping Use: Every day  Substance Use Topics  . Alcohol use: Not Currently    Alcohol/week: 0.0 standard drinks  . Drug use: Yes    Types: Marijuana    Comment: before pregnancy    Home Medications Prior to Admission medications   Medication Sig Start Date End Date Taking? Authorizing Provider  ibuprofen (ADVIL) 800 MG tablet Take 1 tablet (800 mg total) by mouth 3 (three) times daily for 14 days. 06/26/20 07/10/20 Yes Mare FerrariBelaya, Bernetha Anschutz A, PA-C  cyclobenzaprine (FLEXERIL) 10 MG tablet Take 1 tablet (10 mg total) by mouth 2 (two) times daily as needed for muscle spasms. Patient not taking: Reported on 06/26/2020 07/08/19   Lorelee NewGreen, Garrett L, PA-C  etonogestrel-ethinyl estradiol (NUVARING) 0.12-0.015 MG/24HR vaginal ring Insert vaginally and leave in place for 3 consecutive  weeks, then remove for 1 week. Patient not taking: Reported on 06/26/2020 02/11/19   Cheral MarkerBooker, Kimberly R, CNM  naproxen (NAPROSYN) 500 MG tablet Take 1 tablet (500 mg total) by mouth 2 (two) times daily as needed for moderate pain. Patient not taking: Reported on 06/26/2020 07/08/19   Lorelee NewGreen, Garrett L, PA-C  PROAIR HFA 108 [phone](90 Base) MCG/ACT inhaler Inhale 2 puffs into the lungs every 6 (six) hours as needed for wheezing or shortness of breath. 03/30/20   [provider]    Allergies    Penicillins and Promethazine  Review of Systems   Review of Systems  Constitutional: Negative for chills and fever.  HENT: Negative for ear pain and sore throat.   Eyes: Positive for visual disturbance. Negative for pain.  Respiratory: Negative for cough and shortness of breath.   Cardiovascular: Negative for chest pain and palpitations.  Gastrointestinal: Negative for abdominal pain and vomiting.  Genitourinary: Negative for dysuria and hematuria.  Musculoskeletal: Positive for neck pain. Negative for arthralgias, back pain and neck stiffness.  Skin: Negative for color change and rash.  Neurological: Positive for headaches. Negative for seizures and syncope.  All other systems reviewed and are negative.   Physical Exam Updated Vital Signs BP 135/78 (BP Location: Left Arm)   Pulse 87   Temp 98 F (36.7 C) (Oral)   Resp 16   LMP 06/12/2020   SpO2 98%   Physical Exam Vitals and nursing note reviewed.  Constitutional:      General: She is in acute distress.     Appearance: She is well-developed and well-nourished. She is not ill-appearing, toxic-appearing or diaphoretic.     Comments: Crying, appears in pain  HENT:     Head: Normocephalic and atraumatic.  Eyes:     General: No visual field deficit.    Extraocular Movements: Extraocular movements intact.     Conjunctiva/sclera: Conjunctivae normal.     Pupils: Pupils are equal, round, and reactive to light.  Cardiovascular:     Rate and  Rhythm: Normal rate and regular rhythm.     Heart sounds: No murmur heard.   Pulmonary:     Effort: Pulmonary effort is normal. No respiratory distress.     Breath sounds: Normal breath sounds.  Abdominal:     Palpations: Abdomen is soft.     Tenderness: There is no abdominal tenderness.  Musculoskeletal:        General: No edema. Normal range of motion.     Cervical back: Neck supple.  Skin:    General: Skin is warm and dry.  Neurological:     Mental Status: She is  alert and oriented to person, place, and time.     Cranial Nerves: No cranial nerve deficit, dysarthria or facial asymmetry.     Sensory: No sensory deficit.     Motor: No weakness.     Deep Tendon Reflexes: Reflexes normal.     Comments: Mental Status:  Alert, thought content appropriate, able to give a coherent history. Speech fluent without evidence of aphasia. Able to follow 2 step commands without difficulty.  Cranial Nerves:  II: Limited secondary to the patient being resistant to opening her eyes, however pupils equal and reactive III,IV, VI: ptosis not present, extra-ocular motions intact bilaterally  V,VII: smile symmetric, facial light touch sensation equal VIII: hearing grossly normal to voice  X: uvula elevates symmetrically  XI: bilateral shoulder shrug symmetric and strong XII: midline tongue extension without fassiculations Motor:  Normal tone. 5/5 strength of BUE and BLE major muscle groups including strong and equal grip strength and dorsiflexion/plantar flexion Sensory: light touch normal in all extremities. Cerebellar: normal finger-to-nose with bilateral upper extremities, Romberg sign absent Gait: Resistant to ambulating due to pain   No meningeal signs, negative Budzinski, full range of motion of neck  Psychiatric:        Mood and Affect: Mood and affect normal.     ED Results / Procedures / Treatments   Labs (all labs ordered are listed, but only abnormal results are displayed) Labs  Reviewed  CBC WITH DIFFERENTIAL/PLATELET - Abnormal; Notable for the following components:      Result Value   MCV 79.8 (*)    MCH 25.2 (*)    Lymphs Abs 0.5 (*)    All other components within normal limits  COMPREHENSIVE METABOLIC PANEL - Abnormal; Notable for the following components:   Potassium 3.4 (*)    All other components within normal limits  SARS CORONAVIRUS 2 (TAT 6-24 HRS)  PREGNANCY, URINE    EKG None  Radiology CT Head Wo Contrast  Result Date: 06/26/2020 CLINICAL DATA:  Headache, new or worsening, positional. Additional history provided: Patient reports headache, fever, emesis, headache woke patient from sleeping this morning. EXAM: CT HEAD WITHOUT CONTRAST TECHNIQUE: Contiguous axial images were obtained from the base of the skull through the vertex without intravenous contrast. COMPARISON:  Head CT 02/29/2016. FINDINGS: Brain: Cerebral volume is normal. There are low-lying cerebellar tonsils extending beyond the caudal extent of the field of view. The caudal extent of the cerebellar tonsils is not included in the field of view. Associated crowding at the level of the foramen magnum. There is no acute intracranial hemorrhage. No demarcated cortical infarct. No extra-axial fluid collection. No evidence of intracranial mass. No midline shift. Vascular: No hyperdense vessel. Skull: Normal. Negative for fracture or focal lesion. Sinuses/Orbits: Visualized orbits show no acute finding. No significant paranasal sinus disease at the imaged levels. IMPRESSION: There are low-lying cerebellar tonsils extending below the foramen magnum and beyond the caudal extent of the field of view. Associated crowding at the level of the foramen magnum. Findings are highly suspicious for a Chiari I malformation (particularly given the provided history of headache), and a non-contrast brain MRI is recommended for further evaluation. No hydrocephalus. Otherwise unremarkable non-contrast CT appearance of the  brain. Electronically Signed   By: Jackey Loge DO   On: 06/26/2020 18:18   MR BRAIN WO CONTRAST  Result Date: 06/26/2020 CLINICAL DATA:  Headache.  Abnormal head CT. EXAM: MRI HEAD WITHOUT CONTRAST TECHNIQUE: Multiplanar, multiecho pulse sequences of the brain and surrounding structures  were obtained without intravenous contrast. COMPARISON:  CT head from the same day. FINDINGS: Brain: The cerebellar tonsils extend approximately 1.5 cm below the foramen magnum and have a pointed morphology. Additionally, there is inferior herniation of the obex of the medulla below the foramen magnum. Associated crowding at the foramen magnum with some mass effect on the medulla without evidence of edema or upper cervical syrinx. No hydrocephalus. No acute infarct. No acute hemorrhage. No mass lesion. No extra-axial fluid collections. Vascular: Major arterial flow voids are maintained at the skull base. Skull and upper cervical spine: Normal marrow signal. Sinuses/Orbits: Sinuses are largely clear.  Unremarkable orbits. Other: No mastoid effusions. IMPRESSION: 1. 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 Recommend neurosurgical consultation. 2. Otherwise, normal brain MRI. No hydrocephalus. Electronically Signed   By: Feliberto Harts MD   On: 06/26/2020 21:05    Procedures Procedures   Medications Ordered in ED Medications  ondansetron [facility]) injection 4 mg (4 mg Intravenous Given 06/26/20 1755)  fentaNYL (SUBLIMAZE) injection 100 mcg (100 mcg Intravenous Given 06/26/20 1756)  sodium chloride 0.9 % bolus 1,000 mL (0 mLs Intravenous Stopped 06/26/20 2048)  metoCLOPramide (REGLAN) injection 10 mg (10 mg Intravenous Given 06/26/20 1904)  diphenhydrAMINE (BENADRYL) injection 25 mg (25 mg Intravenous Given 06/26/20 1904)  dexamethasone (DECADRON) injection 10 mg (10 mg Intravenous Given 06/26/20 2259)  ketorolac (TORADOL) 30 MG/ML  injection 30 mg (30 mg Intravenous Given 06/26/20 2259)    ED Course  I have reviewed the triage vital signs and the nursing notes.  Pertinent labs & imaging results that were available during my care of the patient were reviewed by me and considered in my medical decision making (see chart for details).    MDM Rules/Calculators/A&P                         20 year old female who presents with severe headache.  Originally on arrival, she appeared to be in distress, crying, refusing to open her eyes as she reports sensitivity to light.  She had no meningeal signs on exam, she had full range of motion of her neck.  Triage was noted patient had reported fevers, however she has been afebrile here throughout her ED course.  She is neurologically intact.  Labs and imaging ordered, reviewed and interpreted by me and radiology -CBC without leukocytosis, normal hemoglobin.  CMP with mild hypokalemia 3.4, no other significant abnormalities noted.  Pregnancy is negative.  Covid test pending.  CT scan of the head shows Chiari malformation.  They recommended further MRI evaluation without contrast.  Patient was treated with Zofran, fentanyl, Reglan and Benadryl.  She was also given a bolus of fluids.  On reevaluation, patient notes mild to moderate improvement in symptoms.  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  Spoke with Dr. Jake Samples with neurosurgery, who recommends Toradol and Decadron as well.  On further reevaluation, patient notes significant provement in pain, states that she is comfortable with going home.  She is resting comfortably in the ER bed, moving about the room freely.  Again low suspicion for meningitis at this time.  Will send home with 800 mg of ibuprofen, strict follow-up instructions to follow-up with Dr. Jake Samples.  We discussed strict return precautions, she voiced understanding and is agreeable.  At this stage in the ED course, the patient is medically screened and stable for  discharge  Case  discussed with supervising attending Dr. Rubin Payor who is agreeable to to the above plan and disposition.  Final Clinical Impression(s) / ED Diagnoses Final diagnoses:  Chiari malformation type I (HCC)  Bad headache    Rx / DC Orders ED Discharge Orders         Ordered    ibuprofen (ADVIL) 800 MG tablet  3 times daily        06/26/20 2330           Leone Brand 06/26/20 2340    Benjiman Core, MD 06/27/20 0021

## 2020-06-27 LAB — SARS CORONAVIRUS 2 (TAT 6-24 HRS): SARS Coronavirus 2: POSITIVE — AB

## 2020-06-28 ENCOUNTER — Telehealth: Payer: Self-pay | Admitting: Nurse Practitioner

## 2020-06-28 NOTE — Telephone Encounter (Signed)
Called to Discuss with patient about Covid symptoms and the use of the monoclonal antibody infusion for those with mild to moderate Covid symptoms and at a high risk of hospitalization.     Pt appears to qualify for this infusion due to co-morbid conditions and/or a member of an at-risk group in accordance with the FDA Emergency Use Authorization.    Unable to reach pt   Symptom onset: Unknown Vaccinated: No Qualified: Patient would be a good candidate for oral therapy if symptom onset is within window (asthma, bmi 30, smoker) per chart review GFR >60 on  06/26/2020 and no contradictory medications.   Willette Alma, NP [facility]  [phone]

## 2020-06-28 NOTE — Telephone Encounter (Signed)
2nd attempt to contact. I spoke with Ms. Loreta Ave who is not having any current COVID symptoms and the headaches have improved. Currently she does not qualify for medication based on EUA guidelines. Information on [facility] provided as onset date could not be established.   Marcos Eke, NP 06/28/2020 1:51 PM

## 2020-09-08 ENCOUNTER — Other Ambulatory Visit: Payer: Self-pay

## 2020-09-08 ENCOUNTER — Emergency Department (HOSPITAL_BASED_OUTPATIENT_CLINIC_OR_DEPARTMENT_OTHER)
Admission: EM | Admit: 2020-09-08 | Discharge: 2020-09-08 | Disposition: A | Discharge status: Home / Self Care | Payer: Medicaid Other | Attending: Emergency Medicine | Admitting: Emergency Medicine

## 2020-09-08 ENCOUNTER — Encounter (HOSPITAL_BASED_OUTPATIENT_CLINIC_OR_DEPARTMENT_OTHER): Payer: Self-pay | Admitting: *Deleted

## 2020-09-08 ENCOUNTER — Emergency Department (HOSPITAL_BASED_OUTPATIENT_CLINIC_OR_DEPARTMENT_OTHER): Payer: Medicaid Other

## 2020-09-08 DIAGNOSIS — R42 Dizziness and giddiness: Secondary | ICD-10-CM | POA: Insufficient documentation

## 2020-09-08 DIAGNOSIS — F17290 Nicotine dependence, other tobacco product, uncomplicated: Secondary | ICD-10-CM | POA: Insufficient documentation

## 2020-09-08 DIAGNOSIS — R0789 Other chest pain: Secondary | ICD-10-CM | POA: Diagnosis present

## 2020-09-08 DIAGNOSIS — J45909 Unspecified asthma, uncomplicated: Secondary | ICD-10-CM | POA: Diagnosis not present

## 2020-09-08 DIAGNOSIS — R079 Chest pain, unspecified: Secondary | ICD-10-CM

## 2020-09-08 LAB — CBC WITH DIFFERENTIAL/PLATELET
Abs Immature Granulocytes: 0.04 10*3/uL (ref 0.00–0.07)
Basophils Absolute: 0.1 10*3/uL (ref 0.0–0.1)
Basophils Relative: 1 %
Eosinophils Absolute: 0.2 10*3/uL (ref 0.0–0.5)
Eosinophils Relative: 2 %
HCT: 40.1 % (ref 36.0–46.0)
Hemoglobin: 12.7 g/dL (ref 12.0–15.0)
Immature Granulocytes: 1 %
Lymphocytes Relative: 32 %
Lymphs Abs: 2.8 10*3/uL (ref 0.7–4.0)
MCH: 25.8 pg — ABNORMAL LOW (ref 26.0–34.0)
MCHC: 31.7 g/dL (ref 30.0–36.0)
MCV: 81.5 fL (ref 80.0–100.0)
Monocytes Absolute: 0.6 10*3/uL (ref 0.1–1.0)
Monocytes Relative: 7 %
Neutro Abs: 5 10*3/uL (ref 1.7–7.7)
Neutrophils Relative %: 57 %
Platelets: 402 10*3/uL — ABNORMAL HIGH (ref 150–400)
RBC: 4.92 MIL/uL (ref 3.87–5.11)
RDW: 15.5 % (ref 11.5–15.5)
WBC: 8.7 10*3/uL (ref 4.0–10.5)
nRBC: 0 % (ref 0.0–0.2)

## 2020-09-08 LAB — TROPONIN I (HIGH SENSITIVITY): Troponin I (High Sensitivity): <2 ng/L (ref <18)

## 2020-09-08 LAB — BASIC METABOLIC PANEL
Anion gap: 10 (ref 5–15)
BUN: 16 mg/dL (ref 6–20)
CO2: 25 mmol/L (ref 22–32)
Calcium: 8.8 mg/dL — ABNORMAL LOW (ref 8.9–10.3)
Chloride: 100 mmol/L (ref 98–111)
Creatinine, Ser: 0.85 mg/dL (ref 0.44–1.00)
GFR, Estimated: >60 mL/min (ref >60)
Glucose, Bld: 90 mg/dL (ref 70–99)
Potassium: 3.6 mmol/L (ref 3.5–5.1)
Sodium: 135 mmol/L (ref 135–145)

## 2020-09-08 NOTE — Discharge Instructions (Signed)
Try diet modification.  Avoiding fried foods or really spicy foods.  Avoid eating right before you go to bed.  If you start having worsening pain and it is starting when you are active, any passing out or inability to catch her breath please return to the emergency room.

## 2020-09-08 NOTE — ED Provider Notes (Signed)
[facility] EMERGENCY DEPARTMENT Provider Note   CSN: [id number] Arrival date & time: 09/08/20  1439     History Chief Complaint  Patient presents with  . Chest Pain    Beth Key is a 20 y.o. female.  Patient is a 20 year old female with a history of symptomatic cholelithiasis, eating disorder, anxiety, almost daily marijuana use who is presenting today with complaint of chest pain.  Patient reports that pain started around 1 PM today while she was driving.  She had a similar episode of pain on Saturday also while she was sitting at rest.  The symptoms felt similar to Saturday symptoms however they lasted longer.  She reports it was a pressure uncomfortable sensation more in the center of her chest but then radiated to her left arm down to her fingers and a little bit into her jaw.  She is not sure if she felt short of breath or if she was just anxious.  She did feel slightly lightheaded during the event.  Today's event lasted about an hour and 45 minutes.  She reports she was having pain when they did the EKG but since she has been waiting and got back to the room the pain is now completely resolved.  She denies any abdominal pain, GERD, nausea, vomiting, cough, fever or URI symptoms.  She does not take any OCPs or have any long trips recently.  No unilateral leg pain or swelling.  The history is provided by the patient.  Chest Pain      Past Medical History:  Diagnosis Date  . Acute pancreatitis 10/08/2013  . ADHD (attention deficit hyperactivity disorder)   . Anxiety   . Asthma    Last asthmatic episode approx. 1 yr ago  . Eating disorder    Pt states that she is anorexic and bulemic; MDs made aware of pt. self-diagnosis  . Gallstones 10/15/2013  . Headache(784.0)   . Symptomatic cholelithiasis 12/31/2013    Patient Active Problem List   Diagnosis Date Noted  . Drug use affecting pregnancy 06/02/2017  . Gastroesophageal reflux disease without esophagitis  10/30/2015  . Panic disorder 07/15/2015  . Substance abuse (HCC) 09/18/2014  . Nicotine abuse 10/15/2013  . 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 Disordered eating 10/15/2013  . Menorrhagia with irregular cycle 10/08/2013    Past Surgical History:  Procedure Laterality Date  . CHOLECYSTECTOMY N/A 12/31/2013   Procedure: LAPAROSCOPIC CHOLECYSTECTOMY ;  Surgeon: Judie Petit. Leonia Corona, MD;  Location: [facility] OR;  Service: Pediatrics;  Laterality: N/A;     OB History    Gravida  1   Para  1   Term  1   Preterm      AB      Living  1     SAB      IAB      Ectopic      Multiple  0   Live Births  1           Family History  Adopted: Yes  Problem Relation Age of Onset  . Alcohol abuse Mother   . Depression Mother   . Drug abuse Mother   . Heart disease Maternal Grandmother   . Hypertension Maternal Grandmother   . Depression Maternal Grandmother   . Heart disease Maternal Aunt   . Hypertension Maternal Aunt   . Depression Maternal Aunt     Social History   Tobacco Use  . Smoking status: Current Every Day Smoker  Types: E-cigarettes    Last attempt to quit: 07/2017    Years since quitting: 3.1  . Smokeless tobacco: Never Used  Vaping Use  . Vaping Use: Every day  Substance Use Topics  . Alcohol use: Not Currently    Alcohol/week: 0.0 standard drinks  . Drug use: Yes    Types: Marijuana    Comment: before pregnancy    Home Medications Prior to Admission medications   Medication Sig Start Date End Date Taking? Authorizing Provider  cyclobenzaprine (FLEXERIL) 10 MG tablet Take 1 tablet (10 mg total) by mouth 2 (two) times daily as needed for muscle spasms. Patient not taking: No sig reported 07/08/19   Lorelee New, PA-C  etonogestrel-ethinyl estradiol (NUVARING) 0.12-0.015 MG/24HR vaginal ring Insert vaginally and leave in place for 3 consecutive weeks, then remove for 1 week. Patient not taking: No sig reported 02/11/19    Cheral Marker, CNM  naproxen (NAPROSYN) 500 MG tablet Take 1 tablet (500 mg total) by mouth 2 (two) times daily as needed for moderate pain. Patient not taking: No sig reported 07/08/19   Lorelee New, PA-C  PROAIR HFA 108 (90 Base) MCG/ACT inhaler Inhale 2 puffs into the lungs every 6 (six) hours as needed for wheezing or shortness of breath. 03/30/20   [provider]    Allergies    Penicillins and Promethazine  Review of Systems   Review of Systems  Cardiovascular: Positive for chest pain.  All other systems reviewed and are negative.   Physical Exam Updated Vital Signs BP 128/84 (BP Location: Right Arm)   Pulse 83   Temp 98.6 F (37 C) (Oral)   Resp 20   Ht 5\' 8"  (1.727 m)   Wt 81.6 kg   LMP 07/21/2020   SpO2 100%   BMI 27.37 kg/m   Physical Exam Vitals and nursing note reviewed.  Constitutional:      General: She is not in acute distress.    Appearance: Normal appearance. She is well-developed.  HENT:     Head: Normocephalic and atraumatic.  Eyes:     Pupils: Pupils are equal, round, and reactive to light.  Cardiovascular:     Rate and Rhythm: Normal rate and regular rhythm.     Heart sounds: Normal heart sounds. No murmur heard. No friction rub.  Pulmonary:     Effort: Pulmonary effort is normal.     Breath sounds: Normal breath sounds. No wheezing or rales.  Abdominal:     General: Bowel sounds are normal. There is no distension.     Palpations: Abdomen is soft.     Tenderness: There is no abdominal tenderness. There is no guarding or rebound.  Musculoskeletal:        General: No tenderness. Normal range of motion.     Right lower leg: No edema.     Left lower leg: No edema.     Comments: No edema  Skin:    General: Skin is warm and dry.     Findings: No rash.  Neurological:     Mental Status: She is alert and oriented to person, place, and time. Mental status is at baseline.     Cranial Nerves: No cranial nerve deficit.   Psychiatric:        Mood and Affect: Mood normal.        Behavior: Behavior normal.     ED Results / Procedures / Treatments   Labs (all labs ordered are listed, but only abnormal  results are displayed) Labs Reviewed  CBC WITH DIFFERENTIAL/PLATELET - Abnormal; Notable for the following components:      Result Value   MCH 25.8 (*)    Platelets 402 (*)    All other components within normal limits  BASIC METABOLIC PANEL - Abnormal; Notable for the following components:   Calcium 8.8 (*)    All other components within normal limits  PREGNANCY, URINE  TROPONIN I (HIGH SENSITIVITY)    EKG EKG Interpretation  Date/Time:  Tuesday September 08 2020 14:46:57 EDT Ventricular Rate:  89 PR Interval:  150 QRS Duration: 66 QT Interval:  346 QTC Calculation: 420 R Axis:   114 Text Interpretation: Normal sinus rhythm Lateral infarct , age undetermined , new T wave inversion Lateral leads since 2019 possible arm lead reversal Reconfirmed by Gwyneth Sproutlunkett, Montina Dorrance ([id number]) on 09/08/2020 4:58:53 PM   Radiology DG Chest Port 1 View  Result Date: 09/08/2020 CLINICAL DATA:  Chest pain EXAM: PORTABLE CHEST 1 VIEW COMPARISON:  03/31/2016 FINDINGS: The heart size and mediastinal contours are within normal limits. Both lungs are clear. The visualized skeletal structures are unremarkable. IMPRESSION: No active disease. Electronically Signed   By: Jasmine PangKim  Fujinaga M.D.   On: 09/08/2020 16:52    Procedures Procedures   Medications Ordered in ED Medications - No data to display  ED Course  I have reviewed the triage vital signs and the nursing notes.  Pertinent labs & imaging results that were available during my care of the patient were reviewed by me and considered in my medical decision making (see chart for details).    MDM Rules/Calculators/A&P                           Patient is a 20 year old female presenting today with chest pain.  This is now the second episode that she has had.  1 on Saturday  and 1 today.  Both while at rest.  She did have left-sided discomfort that went to her arm and neck.  During the episode she would feel slightly lightheaded and had some mild blurry vision.  She denies any new recent stressors in her life and has otherwise been well.  She has never had anything like this before.  She does use marijuana almost daily but denies any cocaine or heroin use.  She also uses e-cigarettes.  She is pain-free currently on exam.  However she was having pain when they got her initial EKG which did show T wave inversion in the lateral leads which suspect is left/right arm lead reversal and is different than an EKG she had in 2019.  Patient has a hear score of 2.  PERC negative.  Low suspicion for dissection, pneumonia.  Patient did just eat after symptoms today but not on Saturday.  She has no abdominal pain consistent with a GI issue.  Will recheck EKG now that her symptoms are gone and confirm leads are correct.  Labs including troponin are pending  5:23 PM All labs including trop are wnl.  Trop is 3 hours after pain started and feel that she is ruled out.  cxr wnl. Repeat EKG with leads inappropriate place is wnl.   Low suspicion at this time for ACS.  Will discharge patient to follow-up with her PCP.  MDM Number of Diagnoses or Management Options   Amount and/or Complexity of Data Reviewed Clinical lab tests: ordered and reviewed Tests in the radiology section of CPT: reviewed and ordered  Tests in the medicine section of CPT: ordered and reviewed Decide to obtain previous medical records or to obtain history from someone other than the patient: yes Review and summarize past medical records: yes Independent visualization of images, tracings, or specimens: yes  Risk of Complications, Morbidity, and/or Mortality Presenting problems: moderate Diagnostic procedures: low Management options: low  Patient Progress Patient progress: improved    Final Clinical Impression(s)  / ED Diagnoses Final diagnoses:  Nonspecific chest pain    Rx / DC Orders ED Discharge Orders    None       Gwyneth Sprout, MD 09/08/20 1730

## 2020-09-08 NOTE — ED Triage Notes (Signed)
2 hours ago while driving she experienced chest pain and left arm pain. Pain free on arrival to the ER. She had the same pain 3 days ago.

## 2020-09-08 NOTE — ED Notes (Signed)
Pt states attempted to obtain urine specimen however was unable to

## 2020-09-16 ENCOUNTER — Other Ambulatory Visit: Payer: Self-pay | Admitting: Neurological Surgery

## 2020-09-22 ENCOUNTER — Inpatient Hospital Stay (HOSPITAL_COMMUNITY): Admission: RE | Admit: 2020-09-22 | Payer: Medicaid Other | Source: Ambulatory Visit

## 2020-10-05 NOTE — Pre-Procedure Instructions (Signed)
Surgical Instructions    Your procedure is scheduled on Friday, May 20th.  Report to [facility] Main Entrance "A" at 1:00 P.M., then check in with the Admitting office.  Call this number if you have problems the morning of surgery:  [phone]   If you have any questions prior to your surgery date call [phone]: Open Monday-Friday 8am-4pm    Remember:  Do not eat or drink after midnight the night before your surgery    Take these medicines the morning of surgery with A SIP OF WATER  cyclobenzaprine (FLEXERIL)-as needed for muscle spasms PROAIR inhaler-as needed; please bring this with you to the hospital.   As of today, STOP taking any Aspirin (unless otherwise instructed by your surgeon) Aleve, Naproxen, Ibuprofen, Motrin, Advil, Goody's, BC's, all herbal medications, fish oil, and all vitamins.                     Do NOT Smoke (Tobacco/Vaping) or drink Alcohol 24 hours prior to your procedure.  If you use a CPAP at night, you may bring all equipment for your overnight stay.   Contacts, glasses, piercing's, hearing aid's, dentures or partials may not be worn into surgery, please bring cases for these belongings.    For patients admitted to the hospital, discharge time will be determined by your treatment team.   Patients discharged the day of surgery will not be allowed to drive home, and someone needs to stay with them for 24 hours.    Special instructions:   [facility]- Preparing For Surgery  Before surgery, you can play an important role. Because skin is not sterile, your skin needs to be as free of germs as possible. You can reduce the number of germs on your skin by washing with CHG (chlorahexidine gluconate) Soap before surgery.  CHG is an antiseptic cleaner which kills germs and bonds with the skin to continue killing germs even after washing.    Oral Hygiene is also important to reduce your risk of infection.  Remember - BRUSH YOUR TEETH THE MORNING OF SURGERY  WITH YOUR REGULAR TOOTHPASTE  Please do not use if you have an allergy to CHG or antibacterial soaps. If your skin becomes reddened/irritated stop using the CHG.  Do not shave (including legs and underarms) for at least 48 hours prior to first CHG shower. It is OK to shave your face.  Please follow these instructions carefully.   1. Shower the NIGHT BEFORE SURGERY and the MORNING OF SURGERY  2. If you chose to wash your hair, wash your hair first as usual with your normal shampoo.  3. After you shampoo, rinse your hair and body thoroughly to remove the shampoo.  4. Use CHG Soap as you would any other liquid soap. You can apply CHG directly to the skin and wash gently with a scrungie or a clean washcloth.   5. Apply the CHG Soap to your body ONLY FROM THE NECK DOWN.  Do not use on open wounds or open sores. Avoid contact with your eyes, ears, mouth and genitals (private parts). Wash Face and genitals (private parts)  with your normal soap.   6. Wash thoroughly, paying special attention to the area where your surgery will be performed.  7. Thoroughly rinse your body with warm water from the neck down.  8. DO NOT shower/wash with your normal soap after using and rinsing off the CHG Soap.  9. Pat yourself dry with a CLEAN TOWEL.  10.  Wear CLEAN PAJAMAS to bed the night before surgery  11. Place CLEAN SHEETS on your bed the night before your surgery  12. DO NOT SLEEP WITH PETS.   Day of Surgery: Shower with CHG soap. Do not wear jewelry, make up, or nail polish Do not wear lotions, powders, perfumes, or deodorant. Do not shave 48 hours prior to surgery.   Do not bring valuables to the hospital. [facility] is not responsible for any belongings or valuables. Wear Clean/Comfortable clothing the morning of surgery Remember to brush your teeth WITH YOUR REGULAR TOOTHPASTE.   Please read over the following fact sheets that you were given.

## 2020-10-06 ENCOUNTER — Encounter (HOSPITAL_COMMUNITY)
Admission: RE | Admit: 2020-10-06 | Discharge: 2020-10-06 | Disposition: A | Discharge status: Home / Self Care | Payer: Medicaid Other | Source: Ambulatory Visit | Attending: Neurological Surgery | Admitting: Neurological Surgery

## 2020-10-06 ENCOUNTER — Encounter (HOSPITAL_COMMUNITY): Payer: Self-pay

## 2020-10-06 ENCOUNTER — Other Ambulatory Visit: Payer: Self-pay

## 2020-10-06 DIAGNOSIS — Z01812 Encounter for preprocedural laboratory examination: Secondary | ICD-10-CM | POA: Insufficient documentation

## 2020-10-06 DIAGNOSIS — Z20822 Contact with and (suspected) exposure to covid-19: Secondary | ICD-10-CM | POA: Insufficient documentation

## 2020-10-06 HISTORY — DX: Gastro-esophageal reflux disease without esophagitis: K21.9

## 2020-10-06 LAB — CBC WITH DIFFERENTIAL/PLATELET
Abs Immature Granulocytes: 0.03 10*3/uL (ref 0.00–0.07)
Basophils Absolute: 0.1 10*3/uL (ref 0.0–0.1)
Basophils Relative: 1 %
Eosinophils Absolute: 0.3 10*3/uL (ref 0.0–0.5)
Eosinophils Relative: 4 %
HCT: 42.2 % (ref 36.0–46.0)
Hemoglobin: 12.9 g/dL (ref 12.0–15.0)
Immature Granulocytes: 0 %
Lymphocytes Relative: 37 %
Lymphs Abs: 2.5 10*3/uL (ref 0.7–4.0)
MCH: 25.4 pg — ABNORMAL LOW (ref 26.0–34.0)
MCHC: 30.6 g/dL (ref 30.0–36.0)
MCV: 83.2 fL (ref 80.0–100.0)
Monocytes Absolute: 0.5 10*3/uL (ref 0.1–1.0)
Monocytes Relative: 7 %
Neutro Abs: 3.5 10*3/uL (ref 1.7–7.7)
Neutrophils Relative %: 51 %
Platelets: 402 10*3/uL — ABNORMAL HIGH (ref 150–400)
RBC: 5.07 MIL/uL (ref 3.87–5.11)
RDW: 14.4 % (ref 11.5–15.5)
WBC: 6.8 10*3/uL (ref 4.0–10.5)
nRBC: 0 % (ref 0.0–0.2)

## 2020-10-06 LAB — SURGICAL PCR SCREEN
MRSA, PCR: NEGATIVE
Staphylococcus aureus: NEGATIVE

## 2020-10-06 LAB — TYPE AND SCREEN
ABO/RH(D): A POS
Antibody Screen: NEGATIVE

## 2020-10-06 LAB — BASIC METABOLIC PANEL
Anion gap: 5 (ref 5–15)
BUN: 11 mg/dL (ref 6–20)
CO2: 27 mmol/L (ref 22–32)
Calcium: 9.1 mg/dL (ref 8.9–10.3)
Chloride: 105 mmol/L (ref 98–111)
Creatinine, Ser: 0.72 mg/dL (ref 0.44–1.00)
GFR, Estimated: >60 mL/min (ref >60)
Glucose, Bld: 91 mg/dL (ref 70–99)
Potassium: 4.2 mmol/L (ref 3.5–5.1)
Sodium: 137 mmol/L (ref 135–145)

## 2020-10-06 LAB — SARS CORONAVIRUS 2 (TAT 6-24 HRS): SARS Coronavirus 2: NEGATIVE

## 2020-10-06 LAB — PROTIME-INR
INR: 1 (ref 0.8–1.2)
Prothrombin Time: 13.2 seconds (ref 11.4–15.2)

## 2020-10-06 NOTE — Progress Notes (Signed)
PCP: No PCP Cardiologist: Denies  EKG: 09-11-20 CXR: 09-08-20 ECHO: denies Stress Test: denies Cardiac Cath: denies  Covid tested today, aware to quarantine  Patient denies shortness of breath, fever, cough, and chest pain at PAT appointment. Pt recently seen in ED for CP--states it was either GERD or a panic attack.  Denies any recent CP.  Patient verbalized understanding of instructions provided today at the PAT appointment.  Patient asked to review instructions at home and day of surgery.

## 2020-10-23 ENCOUNTER — Inpatient Hospital Stay (HOSPITAL_COMMUNITY): Admission: RE | Admit: 2020-10-23 | Payer: Medicaid Other | Source: Home / Self Care | Admitting: Neurological Surgery

## 2020-10-23 ENCOUNTER — Encounter (HOSPITAL_COMMUNITY): Admission: RE | Payer: Self-pay | Source: Home / Self Care

## 2020-10-23 SURGERY — SUBOCCIPITAL CRANIECTOMY CERVICAL LAMINECTOMY/DURAPLASTY
Anesthesia: General

## 2021-01-04 ENCOUNTER — Other Ambulatory Visit: Payer: Self-pay | Admitting: Neurological Surgery

## 2021-01-30 ENCOUNTER — Other Ambulatory Visit: Payer: Self-pay

## 2021-01-30 ENCOUNTER — Encounter (HOSPITAL_BASED_OUTPATIENT_CLINIC_OR_DEPARTMENT_OTHER): Payer: Self-pay | Admitting: Emergency Medicine

## 2021-01-30 ENCOUNTER — Emergency Department (HOSPITAL_BASED_OUTPATIENT_CLINIC_OR_DEPARTMENT_OTHER): Payer: Medicaid Other

## 2021-01-30 ENCOUNTER — Emergency Department (HOSPITAL_BASED_OUTPATIENT_CLINIC_OR_DEPARTMENT_OTHER)
Admission: EM | Admit: 2021-01-30 | Discharge: 2021-01-30 | Disposition: A | Discharge status: Home / Self Care | Payer: Medicaid Other | Attending: Emergency Medicine | Admitting: Emergency Medicine

## 2021-01-30 DIAGNOSIS — J45909 Unspecified asthma, uncomplicated: Secondary | ICD-10-CM | POA: Diagnosis not present

## 2021-01-30 DIAGNOSIS — F17290 Nicotine dependence, other tobacco product, uncomplicated: Secondary | ICD-10-CM | POA: Insufficient documentation

## 2021-01-30 DIAGNOSIS — R072 Precordial pain: Secondary | ICD-10-CM | POA: Insufficient documentation

## 2021-01-30 DIAGNOSIS — R1013 Epigastric pain: Secondary | ICD-10-CM | POA: Diagnosis not present

## 2021-01-30 DIAGNOSIS — R079 Chest pain, unspecified: Secondary | ICD-10-CM

## 2021-01-30 LAB — CBC WITH DIFFERENTIAL/PLATELET
Abs Immature Granulocytes: 0.05 10*3/uL (ref 0.00–0.07)
Basophils Absolute: 0.1 10*3/uL (ref 0.0–0.1)
Basophils Relative: 1 %
Eosinophils Absolute: 0.2 10*3/uL (ref 0.0–0.5)
Eosinophils Relative: 2 %
HCT: 40.8 % (ref 36.0–46.0)
Hemoglobin: 13 g/dL (ref 12.0–15.0)
Immature Granulocytes: 1 %
Lymphocytes Relative: 21 %
Lymphs Abs: 1.8 10*3/uL (ref 0.7–4.0)
MCH: 25.8 pg — ABNORMAL LOW (ref 26.0–34.0)
MCHC: 31.9 g/dL (ref 30.0–36.0)
MCV: 81.1 fL (ref 80.0–100.0)
Monocytes Absolute: 0.6 10*3/uL (ref 0.1–1.0)
Monocytes Relative: 7 %
Neutro Abs: 6 10*3/uL (ref 1.7–7.7)
Neutrophils Relative %: 68 %
Platelets: 441 10*3/uL — ABNORMAL HIGH (ref 150–400)
RBC: 5.03 MIL/uL (ref 3.87–5.11)
RDW: 14.6 % (ref 11.5–15.5)
WBC: 8.7 10*3/uL (ref 4.0–10.5)
nRBC: 0 % (ref 0.0–0.2)

## 2021-01-30 LAB — COMPREHENSIVE METABOLIC PANEL
ALT: 24 U/L (ref 0–44)
AST: 21 U/L (ref 15–41)
Albumin: 4.1 g/dL (ref 3.5–5.0)
Alkaline Phosphatase: 84 U/L (ref 38–126)
Anion gap: 7 (ref 5–15)
BUN: 14 mg/dL (ref 6–20)
CO2: 24 mmol/L (ref 22–32)
Calcium: 8.9 mg/dL (ref 8.9–10.3)
Chloride: 105 mmol/L (ref 98–111)
Creatinine, Ser: 0.79 mg/dL (ref 0.44–1.00)
GFR, Estimated: >60 mL/min (ref >60)
Glucose, Bld: 99 mg/dL (ref 70–99)
Potassium: 4.1 mmol/L (ref 3.5–5.1)
Sodium: 136 mmol/L (ref 135–145)
Total Bilirubin: 0.6 mg/dL (ref 0.3–1.2)
Total Protein: 7.5 g/dL (ref 6.5–8.1)

## 2021-01-30 LAB — PREGNANCY, URINE: Preg Test, Ur: NEGATIVE

## 2021-01-30 LAB — LIPASE, BLOOD: Lipase: 22 U/L (ref 11–51)

## 2021-01-30 LAB — TROPONIN I (HIGH SENSITIVITY): Troponin I (High Sensitivity): <2 ng/L (ref <18)

## 2021-01-30 LAB — D-DIMER, QUANTITATIVE: D-Dimer, Quant: <0.27 ug/mL-FEU (ref 0.00–0.50)

## 2021-01-30 MED ORDER — ALUM & MAG HYDROXIDE-SIMETH 200-200-20 MG/5ML PO SUSP
30.0000 mL | Freq: Once | ORAL | Status: AC
  Administered 2021-01-30: 30 mL via ORAL
  Filled 2021-01-30: qty 30

## 2021-01-30 MED ORDER — LIDOCAINE VISCOUS HCL 2 % MT SOLN
15.0000 mL | Freq: Once | OROMUCOSAL | Status: AC
  Administered 2021-01-30: 15 mL via ORAL
  Filled 2021-01-30: qty 15

## 2021-01-30 MED ORDER — ACETAMINOPHEN 325 MG PO TABS
650.0000 mg | ORAL_TABLET | Freq: Once | ORAL | Status: AC
  Administered 2021-01-30: 650 mg via ORAL
  Filled 2021-01-30: qty 2

## 2021-01-30 MED ORDER — PANTOPRAZOLE SODIUM 20 MG PO TBEC: 20.0000 mg | DELAYED_RELEASE_TABLET | Freq: Every day | ORAL | 0 refills | Status: DC

## 2021-01-30 NOTE — ED Notes (Signed)
On arrival to room to D/C patient home, Pt IV, heart monitor and BP cuff noted to be thrown on the floor. Pt D/C her own IV and refusing vitals at this time. Pt provided D/C papers and quickly walks out of the room to the front lobby.

## 2021-01-30 NOTE — Discharge Instructions (Addendum)
Work-up today is unremarkable.  Suspect this could be related to acid reflux.  Take Protonix for the next 2 weeks and see if that helps her symptoms.  Otherwise recommend Tylenol and ibuprofen for pain as this could be a musculoskeletal problem.  Follow-up with her primary care doctor.

## 2021-01-30 NOTE — ED Triage Notes (Signed)
Sharp throbbing chest pain, shortness of breath. Was seen by ems 1 hour ago , unremarkable ECG, pt reports.  Hx anxiety years back. No meds

## 2021-01-30 NOTE — ED Provider Notes (Signed)
[facility] EMERGENCY DEPARTMENT Provider Note   CSN: [id number] Arrival date & time: 01/30/21  1111     History Chief Complaint  Patient presents with   Chest Pain    Beth Key is a 20 y.o. female.  The history is provided by the patient.  Chest Pain Pain location:  Epigastric and substernal area Pain quality: aching   Pain radiates to:  Does not radiate Pain severity:  Mild Onset quality:  Gradual Duration:  2 hours Timing:  Constant Progression:  Unchanged Chronicity:  New Context: at rest   Relieved by:  Nothing Worsened by:  Nothing Associated symptoms: no abdominal pain, no back pain, no cough, no fever, no palpitations, no shortness of breath and no vomiting   Risk factors: no coronary artery disease, no high cholesterol and no hypertension       Past Medical History:  Diagnosis Date   Acute pancreatitis 10/08/2013   ADHD (attention deficit hyperactivity disorder)    Anxiety    Asthma    Last asthmatic episode approx. 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 Leonia CoronaShuaib Farooqui, MD;  Location: [facility] OR;  Service: Pediatrics;  Laterality: N/A;   WISDOM TOOTH EXTRACTION       OB History     Gravida  1   Para  1    Term  1   Preterm      AB      Living  1      SAB      IAB      Ectopic      Multiple  0   Live Births  1           Family History  Adopted: Yes  Problem Relation Age of Onset   Alcohol abuse Mother    Depression Mother    Drug abuse Mother    Heart disease Maternal Grandmother    Hypertension Maternal Grandmother    Depression Maternal Grandmother    Heart disease Maternal Aunt    Hypertension Maternal Aunt    Depression Maternal Aunt     Social History   Tobacco Use   Smoking status: Every Day    Types: E-cigarettes    Last attempt to quit: 07/2017    Years since quitting: 3.5   Smokeless tobacco: Never  Vaping Use   Vaping Use: Every day   Substances: Nicotine, Flavoring   Devices: Nicotine 5%  Substance Use Topics   Alcohol use: Not Currently    Alcohol/week: 0.0 standard drinks   Drug use: Yes    Types: Marijuana    Comment: 3-4 times per week    Home Medications Prior to Admission medications   Medication Sig Start Date End Date Taking? Authorizing Provider  pantoprazole (PROTONIX)  20 MG tablet Take 1 tablet (20 mg total) by mouth daily for 14 days. 01/30/21 02/13/21 Yes Jennavecia Schwier, DO  Ascorbic Acid (VITAMIN C PO) Take 2 tablets by mouth daily.    [provider]  ASHWAGANDHA PO Take 2 capsules by mouth daily.    [provider]  Biotin 1000 MCG tablet Take 1,000 mcg by mouth daily.    [provider]  cyclobenzaprine (FLEXERIL) 10 MG tablet Take 1 tablet (10 mg total) by mouth 2 (two) times daily as needed for muscle spasms. 07/08/19   Lorelee New, PA-C  etonogestrel-ethinyl estradiol (NUVARING) 0.12-0.015 MG/24HR vaginal ring Insert vaginally and leave in place for 3 consecutive weeks, then remove for 1 week. 02/11/19   Cheral Marker, CNM  naproxen (NAPROSYN) 500 MG tablet Take 1 tablet (500 mg total) by mouth 2 (two) times daily as needed for moderate pain. 07/08/19   Lorelee New, PA-C  OVER THE  COUNTER MEDICATION Take 2 capsules by mouth daily. Lovin' Libido: Ashwagandha, Damiana, Maca    [provider]  PROAIR HFA 108 [phone] Base) MCG/ACT inhaler Inhale 2 puffs into the lungs every 6 (six) hours as needed for wheezing or shortness of breath. 03/30/20   [provider]    Allergies    Penicillins, Promethazine, and Reglan [metoclopramide]  Review of Systems   Review of Systems  Constitutional:  Negative for chills and fever.  HENT:  Negative for ear pain and sore throat.   Eyes:  Negative for pain and visual disturbance.  Respiratory:  Negative for cough and shortness of breath.   Cardiovascular:  Positive for chest pain. Negative for palpitations.  Gastrointestinal:  Negative for abdominal pain and vomiting.  Genitourinary:  Negative for dysuria and hematuria.  Musculoskeletal:  Negative for arthralgias and back pain.  Skin:  Negative for color change and rash.  Neurological:  Negative for seizures and syncope.  All other systems reviewed and are negative.  Physical Exam Updated Vital Signs BP 114/86 (BP Location: Right Arm)   Pulse 89   Temp 98.8 F (37.1 C) (Oral)   Resp 18   Ht 5\' 9"  (1.753 m)   Wt 88.9 kg   LMP 01/23/2021   SpO2 100%   BMI 28.94 kg/m   Physical Exam Vitals and nursing note reviewed.  Constitutional:      General: She is in acute distress.     Appearance: She is well-developed.  HENT:     Head: Normocephalic and atraumatic.  Eyes:     Conjunctiva/sclera: Conjunctivae normal.     Pupils: Pupils are equal, round, and reactive to light.  Cardiovascular:     Rate and Rhythm: Normal rate and regular rhythm.     Pulses:          Radial pulses are 2+ on the right side and 2+ on the left side.     Heart sounds: Normal heart sounds. No murmur heard. Pulmonary:     Effort: Pulmonary effort is normal. No respiratory distress.     Breath sounds: Normal breath sounds. No decreased breath sounds, wheezing, rhonchi or rales.   Abdominal:     Palpations: Abdomen is soft.     Tenderness: There is no abdominal tenderness.  Musculoskeletal:        General: Normal range of motion.     Cervical back: Normal range of motion and neck supple.     Right lower leg: No edema.     Left lower leg:  No edema.  Skin:    General: Skin is warm and dry.  Neurological:     General: No focal deficit present.     Mental Status: She is alert.  Psychiatric:        Mood and Affect: Mood normal.    ED Results / Procedures / Treatments   Labs (all labs ordered are listed, but only abnormal results are displayed) Labs Reviewed  CBC WITH DIFFERENTIAL/PLATELET - Abnormal; Notable for the following components:      Result Value   MCH 25.8 (*)    Platelets 441 (*)    All other components within normal limits  COMPREHENSIVE METABOLIC PANEL  LIPASE, BLOOD  PREGNANCY, URINE  D-DIMER, QUANTITATIVE  TROPONIN I (HIGH SENSITIVITY)    EKG EKG Interpretation  Date/Time:  Saturday January 30 2021 11:23:06 EDT Ventricular Rate:  88 PR Interval:  153 QRS Duration: 69 QT Interval:  344 QTC Calculation: 417 R Axis:   70 Text Interpretation: Normal sinus rhythm Confirmed by Virgina Norfolk ([id number]) on 01/30/2021 11:25:36 AM  Radiology DG Chest Portable 1 View  Result Date: 01/30/2021 CLINICAL DATA:  Central chest pain today EXAM: PORTABLE CHEST 1 VIEW COMPARISON:  September 08, 2020 FINDINGS: The heart size and mediastinal contours are within normal limits. Both lungs are clear. The visualized skeletal structures are unremarkable. IMPRESSION: No active disease. Electronically Signed   By: Sherian Rein M.D.   On: 01/30/2021 11:47    Procedures Procedures   Medications Ordered in ED Medications  alum & mag hydroxide-simeth (MAALOX/MYLANTA) 200-200-20 MG/5ML suspension 30 mL (30 mLs Oral Given 01/30/21 1136)    And  lidocaine (XYLOCAINE) 2 % viscous mouth solution 15 mL (15 mLs Oral Given 01/30/21 1136)  acetaminophen (TYLENOL) tablet  650 mg (650 mg Oral Given 01/30/21 1136)    ED Course  I have reviewed the triage vital signs and the nursing notes.  Pertinent labs & imaging results that were available during my care of the patient were reviewed by me and considered in my medical decision making (see chart for details).    MDM Rules/Calculators/A&P                           Kyarah Enamorado is here with chest pain.  History of reflux, anxiety.  Normal vitals.  No fever.  EKG shows sinus rhythm.  Has been having substernal/epigastric type discomfort for the last 2 hours.  History of pancreatitis in the past as well.  Does not have any PE risk factors but states that is worse when she takes a deep breath.  She has no real focal abdominal tenderness on exam.  She does appear uncomfortable.  This could be reflux or pancreatitis.  Seems less likely to be ACS or PE given risk factors but will check troponin and D-dimer.  Will check lipase and other basic labs as well.  We will give her GI cocktail, Tylenol and reevaluate.  History of Chiari malformation but not having any headaches or other neurological problems at this time.  Could be an element of anxiety/panic attacks as well but will rule out organic causes of her chest pain at this time.  D-dimer normal.  Doubt PE.  No significant anemia, electrolyte ab Mody, kidney injury, leukocytosis.  Gallbladder and liver enzymes within normal limits.  Doubt cholecystitis.  Lipase normal doubt pancreatitis.  Troponin within normal limits and doubt ACS.  Felt better after GI cocktail.  Suspect that this  could be reflux related versus anxiety versus MSK type pain.  Will prescribe PPI.  Recommend follow-up with primary care doctor and discharged from the ED in good condition.  This chart was dictated using voice recognition software.  Despite best efforts to proofread,  errors can occur which can change the documentation meaning.   Final Clinical Impression(s) / ED Diagnoses Final diagnoses:   Nonspecific chest pain    Rx / DC Orders ED Discharge Orders          Ordered    pantoprazole (PROTONIX) 20 MG tablet  Daily        01/30/21 1216             [place], DO 01/30/21 1217

## 2021-02-10 ENCOUNTER — Other Ambulatory Visit: Payer: Self-pay | Admitting: Neurological Surgery

## 2021-02-11 NOTE — Pre-Procedure Instructions (Signed)
Surgical Instructions    Your procedure is scheduled on Tuesday 02/16/21.   Report to [facility] Main Entrance "A" at 05:30 A.M., then check in with the Admitting office.  Call this number if you have problems the morning of surgery:  [phone]   If you have any questions prior to your surgery date call [phone]: Open Monday-Friday 8am-4pm    Remember:  Do not eat or drink after midnight the night before your surgery     Take these medicines the morning of surgery with A SIP OF WATER   PROAIR HFA 108 (90 Base)- If needed    As of today, STOP taking any Aspirin (unless otherwise instructed by your surgeon) Aleve, Naproxen, Ibuprofen, Motrin, Advil, Goody's, BC's, all herbal medications, fish oil, and all vitamins.          Do not wear jewelry or makeup Do not wear lotions, powders, perfumes/colognes, or deodorant. Do not shave 48 hours prior to surgery.  Men may shave face and neck. Do not bring valuables to the hospital. DO Not wear nail polish, gel polish, artificial nails, or any other type of covering on natural nails including finger and toenails. If patients have artificial nails, gel coating, etc. that need to be removed by a nail salon please have this removed prior to surgery or surgery may need to be canceled/delayed if the surgeon/ anesthesia feels like the patient is unable to be adequately monitored.             [facility] is not responsible for any belongings or valuables.  Do NOT Smoke (Tobacco/Vaping)  24 hours prior to your procedure If you use a CPAP at night, you may bring your mask for your overnight stay.   Contacts, glasses, dentures or bridgework may not be worn into surgery, please bring cases for these belongings   For patients admitted to the hospital, discharge time will be determined by your treatment team.   Patients discharged the day of surgery will not be allowed to drive home, and someone needs to stay with them for 24 hours.  NO  VISITORS WILL BE ALLOWED IN PRE-OP WHERE PATIENTS GET READY FOR SURGERY.  ONLY 1 SUPPORT PERSON MAY BE PRESENT WHILE YOU ARE IN SURGERY.  IF YOU ARE TO BE ADMITTED, ONCE YOU ARE IN YOUR ROOM YOU WILL BE ALLOWED TWO (2) VISITORS.  Minor children may have two parents present. Special consideration for safety and communication needs will be reviewed on a case by case basis.  Special instructions:    Oral Hygiene is also important to reduce your risk of infection.  Remember - BRUSH YOUR TEETH THE MORNING OF SURGERY WITH YOUR REGULAR TOOTHPASTE   [facility]- Preparing For Surgery  Before surgery, you can play an important role. Because skin is not sterile, your skin needs to be as free of germs as possible. You can reduce the number of germs on your skin by washing with CHG (chlorahexidine gluconate) Soap before surgery.  CHG is an antiseptic cleaner which kills germs and bonds with the skin to continue killing germs even after washing.     Please do not use if you have an allergy to CHG or antibacterial soaps. If your skin becomes reddened/irritated stop using the CHG.  Do not shave (including legs and underarms) for at least 48 hours prior to first CHG shower. It is OK to shave your face.  Please follow these instructions carefully.     Shower the [employer] BEFORE SURGERY  and the MORNING OF SURGERY with CHG Soap.   If you chose to wash your hair, wash your hair first as usual with your normal shampoo. After you shampoo, rinse your hair and body thoroughly to remove the shampoo.  Then [employer] and genitals (private parts) with your normal soap and rinse thoroughly to remove soap.  After that Use CHG Soap as you would any other liquid soap. You can apply CHG directly to the skin and wash gently with a scrungie or a clean washcloth.   Apply the CHG Soap to your body ONLY FROM THE NECK DOWN.  Do not use on open wounds or open sores. Avoid contact with your eyes, ears, mouth and genitals (private  parts). Wash Face and genitals (private parts)  with your normal soap.   Wash thoroughly, paying special attention to the area where your surgery will be performed.  Thoroughly rinse your body with warm water from the neck down.  DO NOT shower/wash with your normal soap after using and rinsing off the CHG Soap.  Pat yourself dry with a CLEAN TOWEL.  Wear CLEAN PAJAMAS to bed the night before surgery  Place CLEAN SHEETS on your bed the night before your surgery  DO NOT SLEEP WITH PETS.   Day of Surgery:  Take a shower with CHG soap. Wear Clean/Comfortable clothing the morning of surgery Do not apply any deodorants/lotions.   Remember to brush your teeth WITH YOUR REGULAR TOOTHPASTE.   Please read over the following fact sheets that you were given.

## 2021-02-12 ENCOUNTER — Encounter (HOSPITAL_COMMUNITY): Payer: Self-pay | Admitting: Neurological Surgery

## 2021-02-12 ENCOUNTER — Other Ambulatory Visit: Payer: Self-pay

## 2021-02-12 ENCOUNTER — Inpatient Hospital Stay (HOSPITAL_COMMUNITY)
Admission: RE | Admit: 2021-02-12 | Discharge: 2021-02-12 | Disposition: A | Discharge status: Home / Self Care | Payer: Medicaid Other | Source: Ambulatory Visit

## 2021-02-12 NOTE — Progress Notes (Signed)
Spent 20 minutes completing pre-op phone call with patient.   Voiced understanding of instructions: arrival time of 0530 at [place] on 02/16/2021, nothing to eat or drink after midnight, take bath night before and morning of surgery- do not apply anything to skin after bath, bring albuterol inhaler day of surgery.

## 2021-02-15 NOTE — Anesthesia Preprocedure Evaluation (Addendum)
Anesthesia Evaluation  Patient identified by MRN, date of birth, ID band Patient awake    Reviewed: Allergy & Precautions, NPO status , Patient's Chart, lab work & pertinent test results  Airway Mallampati: II  TM Distance: >3 FB Neck ROM: Full    Dental  (+) Dental Advisory Given, Teeth Intact   Pulmonary asthma , Current Smoker and Patient abstained from smoking.,    Pulmonary exam normal breath sounds clear to auscultation       Cardiovascular Normal cardiovascular exam Rhythm:Regular Rate:Normal     Neuro/Psych  Headaches, PSYCHIATRIC DISORDERS Anxiety    GI/Hepatic Neg liver ROS, GERD  ,  Endo/Other  negative endocrine ROS  Renal/GU negative Renal ROS     Musculoskeletal negative musculoskeletal ROS (+)   Abdominal   Peds  Hematology negative hematology ROS (+)   Anesthesia Other Findings   Reproductive/Obstetrics                            Anesthesia Physical Anesthesia Plan  ASA: 2  Anesthesia Plan: General   Post-op Pain Management:    Induction: Intravenous  PONV Risk Score and Plan: 3 and Ondansetron, Aprepitant, Dexamethasone, Propofol infusion, Midazolam and Treatment may vary due to age or medical condition  Airway Management Planned: Oral ETT  Additional Equipment: Arterial line  Intra-op Plan:   Post-operative Plan: Extubation in OR  Informed Consent: I have reviewed the patients History and Physical, chart, labs and discussed the procedure including the risks, benefits and alternatives for the proposed anesthesia with the patient or authorized representative who has indicated his/her understanding and acceptance.     Dental advisory given  Plan Discussed with: CRNA  Anesthesia Plan Comments: (2 x PIV, remi gtt)       Anesthesia Quick Evaluation

## 2021-02-16 ENCOUNTER — Other Ambulatory Visit: Payer: Self-pay

## 2021-02-16 ENCOUNTER — Inpatient Hospital Stay (HOSPITAL_COMMUNITY): Payer: Medicaid Other | Admitting: Certified Registered"

## 2021-02-16 ENCOUNTER — Encounter (HOSPITAL_COMMUNITY)
Admission: RE | Disposition: A | Discharge status: Home / Self Care | Payer: Self-pay | Source: Ambulatory Visit | Attending: Neurological Surgery

## 2021-02-16 ENCOUNTER — Inpatient Hospital Stay (HOSPITAL_COMMUNITY)
Admission: RE | Admit: 2021-02-16 | Discharge: 2021-02-19 | DRG: 026 | Disposition: A | Discharge status: Home / Self Care | Payer: Medicaid Other | Source: Ambulatory Visit | Attending: Neurological Surgery | Admitting: Neurological Surgery

## 2021-02-16 ENCOUNTER — Encounter (HOSPITAL_COMMUNITY): Payer: Self-pay | Admitting: Neurological Surgery

## 2021-02-16 DIAGNOSIS — Z8249 Family history of ischemic heart disease and other diseases of the circulatory system: Secondary | ICD-10-CM | POA: Diagnosis not present

## 2021-02-16 DIAGNOSIS — R630 Anorexia: Secondary | ICD-10-CM | POA: Diagnosis present

## 2021-02-16 DIAGNOSIS — F909 Attention-deficit hyperactivity disorder, unspecified type: Secondary | ICD-10-CM | POA: Diagnosis present

## 2021-02-16 DIAGNOSIS — G950 Syringomyelia and syringobulbia: Secondary | ICD-10-CM | POA: Diagnosis present

## 2021-02-16 DIAGNOSIS — J45909 Unspecified asthma, uncomplicated: Secondary | ICD-10-CM | POA: Diagnosis present

## 2021-02-16 DIAGNOSIS — G935 Compression of brain: Principal | ICD-10-CM | POA: Diagnosis present

## 2021-02-16 DIAGNOSIS — Z818 Family history of other mental and behavioral disorders: Secondary | ICD-10-CM

## 2021-02-16 DIAGNOSIS — Z20822 Contact with and (suspected) exposure to covid-19: Secondary | ICD-10-CM | POA: Diagnosis present

## 2021-02-16 DIAGNOSIS — F17290 Nicotine dependence, other tobacco product, uncomplicated: Secondary | ICD-10-CM | POA: Diagnosis present

## 2021-02-16 DIAGNOSIS — F419 Anxiety disorder, unspecified: Secondary | ICD-10-CM | POA: Diagnosis present

## 2021-02-16 DIAGNOSIS — K219 Gastro-esophageal reflux disease without esophagitis: Secondary | ICD-10-CM | POA: Diagnosis present

## 2021-02-16 HISTORY — PX: SUBOCCIPITAL CRANIECTOMY CERVICAL LAMINECTOMY: SHX5404

## 2021-02-16 LAB — CBC
HCT: 42.4 % (ref 36.0–46.0)
Hemoglobin: 13.3 g/dL (ref 12.0–15.0)
MCH: 25.8 pg — ABNORMAL LOW (ref 26.0–34.0)
MCHC: 31.4 g/dL (ref 30.0–36.0)
MCV: 82.3 fL (ref 80.0–100.0)
Platelets: 362 10*3/uL (ref 150–400)
RBC: 5.15 MIL/uL — ABNORMAL HIGH (ref 3.87–5.11)
RDW: 14.6 % (ref 11.5–15.5)
WBC: 7.9 10*3/uL (ref 4.0–10.5)
nRBC: 0 % (ref 0.0–0.2)

## 2021-02-16 LAB — POCT I-STAT 7, (LYTES, BLD GAS, ICA,H+H)
Acid-base deficit: 5 mmol/L — ABNORMAL HIGH (ref 0.0–2.0)
Bicarbonate: 19.3 mmol/L — ABNORMAL LOW (ref 20.0–28.0)
Calcium, Ion: 1.08 mmol/L — ABNORMAL LOW (ref 1.15–1.40)
HCT: 31 % — ABNORMAL LOW (ref 36.0–46.0)
Hemoglobin: 10.5 g/dL — ABNORMAL LOW (ref 12.0–15.0)
O2 Saturation: 100 %
Patient temperature: 35.6
Potassium: 3.4 mmol/L — ABNORMAL LOW (ref 3.5–5.1)
Sodium: 140 mmol/L (ref 135–145)
TCO2: 20 mmol/L — ABNORMAL LOW (ref 22–32)
pCO2 arterial: 29.1 mmHg — ABNORMAL LOW (ref 32.0–48.0)
pH, Arterial: 7.423 (ref 7.350–7.450)
pO2, Arterial: 190 mmHg — ABNORMAL HIGH (ref 83.0–108.0)

## 2021-02-16 LAB — COMPREHENSIVE METABOLIC PANEL
ALT: 18 U/L (ref 0–44)
AST: 18 U/L (ref 15–41)
Albumin: 3.8 g/dL (ref 3.5–5.0)
Alkaline Phosphatase: 65 U/L (ref 38–126)
Anion gap: 8 (ref 5–15)
BUN: 11 mg/dL (ref 6–20)
CO2: 21 mmol/L — ABNORMAL LOW (ref 22–32)
Calcium: 8.9 mg/dL (ref 8.9–10.3)
Chloride: 106 mmol/L (ref 98–111)
Creatinine, Ser: 0.71 mg/dL (ref 0.44–1.00)
GFR, Estimated: >60 mL/min (ref >60)
Glucose, Bld: 98 mg/dL (ref 70–99)
Potassium: 3.7 mmol/L (ref 3.5–5.1)
Sodium: 135 mmol/L (ref 135–145)
Total Bilirubin: 0.6 mg/dL (ref 0.3–1.2)
Total Protein: 6.8 g/dL (ref 6.5–8.1)

## 2021-02-16 LAB — SURGICAL PCR SCREEN
MRSA, PCR: NEGATIVE
Staphylococcus aureus: NEGATIVE

## 2021-02-16 LAB — SARS CORONAVIRUS 2 BY RT PCR (HOSPITAL ORDER, PERFORMED IN ~~LOC~~ HOSPITAL LAB): SARS Coronavirus 2: NEGATIVE

## 2021-02-16 LAB — TYPE AND SCREEN
ABO/RH(D): A POS
Antibody Screen: NEGATIVE

## 2021-02-16 LAB — POCT PREGNANCY, URINE: Preg Test, Ur: NEGATIVE

## 2021-02-16 SURGERY — SUBOCCIPITAL CRANIECTOMY CERVICAL LAMINECTOMY/DURAPLASTY
Anesthesia: General

## 2021-02-16 MED ORDER — DEXMEDETOMIDINE (PRECEDEX) IN NS 20 MCG/5ML (4 MCG/ML) IV SYRINGE
PREFILLED_SYRINGE | INTRAVENOUS | Status: DC | PRN
  Administered 2021-02-16: 4 ug via INTRAVENOUS
  Administered 2021-02-16: 8 ug via INTRAVENOUS
  Administered 2021-02-16 (×2): 4 ug via INTRAVENOUS

## 2021-02-16 MED ORDER — HYDROMORPHONE HCL 1 MG/ML IJ SOLN
0.5000 mg | INTRAMUSCULAR | Status: DC | PRN
  Administered 2021-02-16: 0.5 mg via INTRAVENOUS
  Administered 2021-02-16 – 2021-02-19 (×18): 1 mg via INTRAVENOUS
  Filled 2021-02-16 (×20): qty 1

## 2021-02-16 MED ORDER — THROMBIN 5000 UNITS EX SOLR
CUTANEOUS | Status: DC | PRN
  Administered 2021-02-16 (×2): 5000 [IU] via TOPICAL

## 2021-02-16 MED ORDER — ONDANSETRON HCL 4 MG/2ML IJ SOLN
INTRAMUSCULAR | Status: AC
  Filled 2021-02-16: qty 2

## 2021-02-16 MED ORDER — ONDANSETRON HCL 4 MG/2ML IJ SOLN
INTRAMUSCULAR | Status: DC | PRN
  Administered 2021-02-16: 4 mg via INTRAVENOUS

## 2021-02-16 MED ORDER — HEMOSTATIC AGENTS (NO CHARGE) OPTIME
TOPICAL | Status: DC | PRN
  Administered 2021-02-16 (×2): 1 via TOPICAL

## 2021-02-16 MED ORDER — ACETAMINOPHEN 325 MG PO TABS: 650.0000 mg | ORAL_TABLET | ORAL | Status: DC | PRN

## 2021-02-16 MED ORDER — ACETAMINOPHEN 650 MG RE SUPP: 650.0000 mg | RECTAL | Status: DC | PRN

## 2021-02-16 MED ORDER — BUPIVACAINE LIPOSOME 1.3 % IJ SUSP
INTRAMUSCULAR | Status: AC
  Filled 2021-02-16: qty 20

## 2021-02-16 MED ORDER — ROCURONIUM BROMIDE 100 MG/10ML IV SOLN
INTRAVENOUS | Status: DC | PRN
  Administered 2021-02-16: 40 mg via INTRAVENOUS
  Administered 2021-02-16: 60 mg via INTRAVENOUS
  Administered 2021-02-16 (×3): 20 mg via INTRAVENOUS

## 2021-02-16 MED ORDER — PROPOFOL 10 MG/ML IV BOLUS
INTRAVENOUS | Status: AC
  Filled 2021-02-16: qty 40

## 2021-02-16 MED ORDER — LACTATED RINGERS IV SOLN: INTRAVENOUS | Status: DC

## 2021-02-16 MED ORDER — CHLORHEXIDINE GLUCONATE 0.12 % MT SOLN
15.0000 mL | Freq: Once | OROMUCOSAL | Status: AC
  Administered 2021-02-16: 15 mL via OROMUCOSAL
  Filled 2021-02-16: qty 15

## 2021-02-16 MED ORDER — BUPIVACAINE-EPINEPHRINE 0.5% -1:200000 IJ SOLN
INTRAMUSCULAR | Status: AC
  Filled 2021-02-16: qty 1

## 2021-02-16 MED ORDER — THROMBIN 5000 UNITS EX SOLR
OROMUCOSAL | Status: DC | PRN
  Administered 2021-02-16: 5 mL via TOPICAL

## 2021-02-16 MED ORDER — THROMBIN 5000 UNITS EX SOLR
CUTANEOUS | Status: AC
  Filled 2021-02-16: qty 5000

## 2021-02-16 MED ORDER — LIDOCAINE HCL (PF) 2 % IJ SOLN
INTRAMUSCULAR | Status: AC
  Filled 2021-02-16: qty 5

## 2021-02-16 MED ORDER — SENNOSIDES-DOCUSATE SODIUM 8.6-50 MG PO TABS: 1.0000 | ORAL_TABLET | Freq: Every evening | ORAL | Status: DC | PRN

## 2021-02-16 MED ORDER — ONDANSETRON HCL 4 MG/2ML IJ SOLN
4.0000 mg | INTRAMUSCULAR | Status: DC | PRN
Start: 2021-02-16 — End: 2021-02-19
  Administered 2021-02-16 – 2021-02-17 (×3): 4 mg via INTRAVENOUS
  Filled 2021-02-16 (×3): qty 2

## 2021-02-16 MED ORDER — HYDROMORPHONE HCL 1 MG/ML IJ SOLN
0.2500 mg | INTRAMUSCULAR | Status: DC | PRN
  Administered 2021-02-16 (×2): 0.5 mg via INTRAVENOUS

## 2021-02-16 MED ORDER — LABETALOL HCL 5 MG/ML IV SOLN
10.0000 mg | INTRAVENOUS | Status: DC | PRN
  Administered 2021-02-16: 10 mg via INTRAVENOUS
  Filled 2021-02-16: qty 4

## 2021-02-16 MED ORDER — PROPOFOL 10 MG/ML IV BOLUS
INTRAVENOUS | Status: DC | PRN
  Administered 2021-02-16: 70 mg via INTRAVENOUS
  Administered 2021-02-16: 50 mg via INTRAVENOUS
  Administered 2021-02-16: 200 mg via INTRAVENOUS

## 2021-02-16 MED ORDER — MICROFIBRILLAR COLL HEMOSTAT EX POWD
CUTANEOUS | Status: AC
  Filled 2021-02-16: qty 5

## 2021-02-16 MED ORDER — BACITRACIN ZINC 500 UNIT/GM EX OINT
TOPICAL_OINTMENT | CUTANEOUS | Status: DC | PRN
  Administered 2021-02-16: 1 via TOPICAL

## 2021-02-16 MED ORDER — LACTATED RINGERS IV SOLN: INTRAVENOUS | Status: DC | PRN

## 2021-02-16 MED ORDER — BUPIVACAINE-EPINEPHRINE (PF) 0.5% -1:200000 IJ SOLN
INTRAMUSCULAR | Status: DC | PRN
  Administered 2021-02-16: 5 mL via PERINEURAL

## 2021-02-16 MED ORDER — MIDAZOLAM HCL 2 MG/2ML IJ SOLN
INTRAMUSCULAR | Status: DC | PRN
  Administered 2021-02-16: 2 mg via INTRAVENOUS

## 2021-02-16 MED ORDER — VANCOMYCIN HCL IN DEXTROSE 1-5 GM/200ML-% IV SOLN
1000.0000 mg | Freq: Two times a day (BID) | INTRAVENOUS | Status: AC
  Administered 2021-02-16 – 2021-02-17 (×2): 1000 mg via INTRAVENOUS
  Filled 2021-02-16 (×2): qty 200

## 2021-02-16 MED ORDER — APREPITANT 40 MG PO CAPS
40.0000 mg | ORAL_CAPSULE | Freq: Once | ORAL | Status: AC
  Administered 2021-02-16: 40 mg via ORAL
  Filled 2021-02-16: qty 1

## 2021-02-16 MED ORDER — CHLORHEXIDINE GLUCONATE CLOTH 2 % EX PADS: 6.0000 | MEDICATED_PAD | Freq: Once | CUTANEOUS | Status: DC

## 2021-02-16 MED ORDER — LIDOCAINE HCL (CARDIAC) PF 100 MG/5ML IV SOSY
PREFILLED_SYRINGE | INTRAVENOUS | Status: DC | PRN
  Administered 2021-02-16: 80 mg via INTRATRACHEAL

## 2021-02-16 MED ORDER — ROCURONIUM BROMIDE 10 MG/ML (PF) SYRINGE
PREFILLED_SYRINGE | INTRAVENOUS | Status: AC
  Filled 2021-02-16: qty 20

## 2021-02-16 MED ORDER — FENTANYL CITRATE (PF) 250 MCG/5ML IJ SOLN
INTRAMUSCULAR | Status: AC
  Filled 2021-02-16: qty 5

## 2021-02-16 MED ORDER — PHENYLEPHRINE 40 MCG/ML (10ML) SYRINGE FOR IV PUSH (FOR BLOOD PRESSURE SUPPORT)
PREFILLED_SYRINGE | INTRAVENOUS | Status: AC
  Filled 2021-02-16: qty 10

## 2021-02-16 MED ORDER — ACETAMINOPHEN 10 MG/ML IV SOLN
INTRAVENOUS | Status: AC
  Filled 2021-02-16: qty 100

## 2021-02-16 MED ORDER — LIDOCAINE-EPINEPHRINE 1 %-1:100000 IJ SOLN
INTRAMUSCULAR | Status: DC | PRN
  Administered 2021-02-16: 5 mL

## 2021-02-16 MED ORDER — PANTOPRAZOLE SODIUM 40 MG IV SOLR
40.0000 mg | Freq: Every day | INTRAVENOUS | Status: DC
  Administered 2021-02-16 – 2021-02-18 (×3): 40 mg via INTRAVENOUS
  Filled 2021-02-16 (×3): qty 40

## 2021-02-16 MED ORDER — PROPOFOL 500 MG/50ML IV EMUL
INTRAVENOUS | Status: DC | PRN
  Administered 2021-02-16: 25 ug/kg/min via INTRAVENOUS

## 2021-02-16 MED ORDER — ACETAMINOPHEN 10 MG/ML IV SOLN
INTRAVENOUS | Status: DC | PRN
  Administered 2021-02-16: 1000 mg via INTRAVENOUS

## 2021-02-16 MED ORDER — VANCOMYCIN HCL IN DEXTROSE 1-5 GM/200ML-% IV SOLN
1000.0000 mg | INTRAVENOUS | Status: AC
  Administered 2021-02-16: 1000 mg via INTRAVENOUS
  Filled 2021-02-16: qty 200

## 2021-02-16 MED ORDER — BUPIVACAINE LIPOSOME 1.3 % IJ SUSP
INTRAMUSCULAR | Status: DC | PRN
  Administered 2021-02-16: 20 mL

## 2021-02-16 MED ORDER — HYDROCODONE-ACETAMINOPHEN 5-325 MG PO TABS
1.0000 | ORAL_TABLET | ORAL | Status: DC | PRN
  Administered 2021-02-17 – 2021-02-19 (×10): 1 via ORAL
  Filled 2021-02-16 (×11): qty 1

## 2021-02-16 MED ORDER — MIDAZOLAM HCL 2 MG/2ML IJ SOLN
INTRAMUSCULAR | Status: AC
  Filled 2021-02-16: qty 2

## 2021-02-16 MED ORDER — DEXAMETHASONE SODIUM PHOSPHATE 10 MG/ML IJ SOLN
INTRAMUSCULAR | Status: AC
  Filled 2021-02-16: qty 1

## 2021-02-16 MED ORDER — HEPARIN SODIUM (PORCINE) 5000 UNIT/ML IJ SOLN
5000.0000 [IU] | Freq: Two times a day (BID) | INTRAMUSCULAR | Status: DC
  Administered 2021-02-17 – 2021-02-18 (×4): 5000 [IU] via SUBCUTANEOUS
  Filled 2021-02-16 (×4): qty 1

## 2021-02-16 MED ORDER — SODIUM CHLORIDE 0.9 % IV SOLN: INTRAVENOUS | Status: DC

## 2021-02-16 MED ORDER — BACITRACIN ZINC 500 UNIT/GM EX OINT
TOPICAL_OINTMENT | CUTANEOUS | Status: AC
  Filled 2021-02-16: qty 28.35

## 2021-02-16 MED ORDER — KETAMINE HCL 10 MG/ML IJ SOLN
INTRAMUSCULAR | Status: DC | PRN
  Administered 2021-02-16 (×2): 10 mg via INTRAVENOUS
  Administered 2021-02-16: 25 mg via INTRAVENOUS

## 2021-02-16 MED ORDER — ALUM & MAG HYDROXIDE-SIMETH 200-200-20 MG/5ML PO SUSP
15.0000 mL | ORAL | Status: DC | PRN
  Administered 2021-02-16: 15 mL via ORAL
  Filled 2021-02-16: qty 30

## 2021-02-16 MED ORDER — HYDROMORPHONE HCL 1 MG/ML IJ SOLN
INTRAMUSCULAR | Status: AC
  Filled 2021-02-16: qty 1

## 2021-02-16 MED ORDER — AMISULPRIDE (ANTIEMETIC) 5 MG/2ML IV SOLN: 10.0000 mg | Freq: Once | INTRAVENOUS | Status: DC | PRN

## 2021-02-16 MED ORDER — CHLORHEXIDINE GLUCONATE CLOTH 2 % EX PADS
6.0000 | MEDICATED_PAD | Freq: Every day | CUTANEOUS | Status: DC
  Administered 2021-02-16 – 2021-02-18 (×3): 6 via TOPICAL

## 2021-02-16 MED ORDER — LIDOCAINE-EPINEPHRINE 1 %-1:100000 IJ SOLN
INTRAMUSCULAR | Status: AC
  Filled 2021-02-16: qty 1

## 2021-02-16 MED ORDER — PHENYLEPHRINE HCL-NACL 20-0.9 MG/250ML-% IV SOLN
INTRAVENOUS | Status: AC
  Filled 2021-02-16: qty 250

## 2021-02-16 MED ORDER — THROMBIN 5000 UNITS EX SOLR
CUTANEOUS | Status: AC
  Filled 2021-02-16: qty 10000

## 2021-02-16 MED ORDER — ALBUTEROL SULFATE (2.5 MG/3ML) 0.083% IN NEBU: 3.0000 mL | INHALATION_SOLUTION | Freq: Four times a day (QID) | RESPIRATORY_TRACT | Status: DC | PRN

## 2021-02-16 MED ORDER — ORAL CARE MOUTH RINSE: 15.0000 mL | Freq: Once | OROMUCOSAL | Status: AC

## 2021-02-16 MED ORDER — ROCURONIUM BROMIDE 10 MG/ML (PF) SYRINGE
PREFILLED_SYRINGE | INTRAVENOUS | Status: AC
  Filled 2021-02-16: qty 10

## 2021-02-16 MED ORDER — ONDANSETRON HCL 4 MG PO TABS
4.0000 mg | ORAL_TABLET | ORAL | Status: DC | PRN
  Administered 2021-02-17 – 2021-02-18 (×6): 4 mg via ORAL
  Filled 2021-02-16 (×7): qty 1

## 2021-02-16 MED ORDER — SUGAMMADEX SODIUM 200 MG/2ML IV SOLN
INTRAVENOUS | Status: DC | PRN
  Administered 2021-02-16 (×2): 100 mg via INTRAVENOUS

## 2021-02-16 MED ORDER — FENTANYL CITRATE (PF) 250 MCG/5ML IJ SOLN
INTRAMUSCULAR | Status: DC | PRN
  Administered 2021-02-16: 100 ug via INTRAVENOUS
  Administered 2021-02-16: 150 ug via INTRAVENOUS

## 2021-02-16 MED ORDER — MEPERIDINE HCL 25 MG/ML IJ SOLN: 6.2500 mg | INTRAMUSCULAR | Status: DC | PRN

## 2021-02-16 MED ORDER — KETAMINE HCL 50 MG/5ML IJ SOSY
PREFILLED_SYRINGE | INTRAMUSCULAR | Status: AC
  Filled 2021-02-16: qty 5

## 2021-02-16 MED ORDER — PHENYLEPHRINE 40 MCG/ML (10ML) SYRINGE FOR IV PUSH (FOR BLOOD PRESSURE SUPPORT)
PREFILLED_SYRINGE | INTRAVENOUS | Status: DC | PRN
  Administered 2021-02-16: 80 ug via INTRAVENOUS
  Administered 2021-02-16 (×2): 40 ug via INTRAVENOUS
  Administered 2021-02-16: 80 ug via INTRAVENOUS
  Administered 2021-02-16 (×2): 40 ug via INTRAVENOUS
  Administered 2021-02-16: 80 ug via INTRAVENOUS

## 2021-02-16 SURGICAL SUPPLY — 61 items
BAG COUNTER SPONGE SURGICOUNT (BAG) ×2 IMPLANT
BAND RUBBER #18 3X1/16 STRL (MISCELLANEOUS) IMPLANT
BENZOIN TINCTURE PRP APPL 2/3 (GAUZE/BANDAGES/DRESSINGS) IMPLANT
BLADE CLIPPER SURG (BLADE) ×4 IMPLANT
BLADE SURG 11 STRL SS (BLADE) ×2 IMPLANT
BLADE ULTRA TIP 2M (BLADE) IMPLANT
BUR ACORN 9.0 PRECISION (BURR) ×2 IMPLANT
CANISTER SUCT 3000ML PPV (MISCELLANEOUS) ×2 IMPLANT
CLIP VESOCCLUDE MED 6/CT (CLIP) IMPLANT
DERMABOND ADVANCED (GAUZE/BANDAGES/DRESSINGS) ×1
DERMABOND ADVANCED .7 DNX12 (GAUZE/BANDAGES/DRESSINGS) ×1 IMPLANT
DRAPE LAPAROTOMY 100X72 PEDS (DRAPES) ×2 IMPLANT
DRAPE MICROSCOPE LEICA (MISCELLANEOUS) IMPLANT
DRAPE SURG 17X23 STRL (DRAPES) ×4 IMPLANT
DRAPE WARM FLUID 44X44 (DRAPES) ×2 IMPLANT
DRSG OPSITE POSTOP 4X6 (GAUZE/BANDAGES/DRESSINGS) ×2 IMPLANT
DURAGUARD 06CMX08CM ×2 IMPLANT
ELECT REM PT RETURN 9FT ADLT (ELECTROSURGICAL) ×2
ELECTRODE REM PT RTRN 9FT ADLT (ELECTROSURGICAL) ×1 IMPLANT
EVACUATOR 1/8 PVC DRAIN (DRAIN) IMPLANT
EVACUATOR SILICONE 100CC (DRAIN) IMPLANT
GAUZE 4X4 16PLY ~~LOC~~+RFID DBL (SPONGE) IMPLANT
GAUZE SPONGE 4X4 12PLY STRL (GAUZE/BANDAGES/DRESSINGS) IMPLANT
GLOVE EXAM NITRILE XL STR (GLOVE) IMPLANT
GLOVE SRG 8 PF TXTR STRL LF DI (GLOVE) ×1 IMPLANT
GLOVE SURG ENC MOIS LTX SZ7 (GLOVE) IMPLANT
GLOVE SURG ENC MOIS LTX SZ7.5 (GLOVE) IMPLANT
GLOVE SURG LTX SZ8 (GLOVE) ×2 IMPLANT
GLOVE SURG UNDER POLY LF SZ8 (GLOVE) ×1
GOWN STRL REUS W/ TWL LRG LVL3 (GOWN DISPOSABLE) ×1 IMPLANT
GOWN STRL REUS W/ TWL XL LVL3 (GOWN DISPOSABLE) ×1 IMPLANT
GOWN STRL REUS W/TWL 2XL LVL3 (GOWN DISPOSABLE) ×2 IMPLANT
GOWN STRL REUS W/TWL LRG LVL3 (GOWN DISPOSABLE) ×1
GOWN STRL REUS W/TWL XL LVL3 (GOWN DISPOSABLE) ×1
HEMOSTAT POWDER KIT SURGIFOAM (HEMOSTASIS) ×2 IMPLANT
HEMOSTAT SURGICEL 2X14 (HEMOSTASIS) ×2 IMPLANT
KIT BASIN OR (CUSTOM PROCEDURE TRAY) ×2 IMPLANT
KIT TURNOVER KIT B (KITS) ×2 IMPLANT
NEEDLE HYPO 22GX1.5 SAFETY (NEEDLE) ×2 IMPLANT
NS IRRIG 1000ML POUR BTL (IV SOLUTION) ×4 IMPLANT
PACK LAMINECTOMY NEURO (CUSTOM PROCEDURE TRAY) ×2 IMPLANT
PAD ARMBOARD 7.5X6 YLW CONV (MISCELLANEOUS) ×6 IMPLANT
PATTIES SURGICAL 1/4 X 3 (GAUZE/BANDAGES/DRESSINGS) ×2 IMPLANT
SEALANT ADHERUS EXTEND TIP (MISCELLANEOUS) ×2 IMPLANT
SPONGE SURGIFOAM ABS GEL SZ50 (HEMOSTASIS) ×2 IMPLANT
SPONGE T-LAP 4X18 ~~LOC~~+RFID (SPONGE) IMPLANT
STAPLER VISISTAT 35W (STAPLE) IMPLANT
SUT ETHILON 2 0 FS 18 (SUTURE) IMPLANT
SUT ETHILON 3 0 FSL (SUTURE) ×2 IMPLANT
SUT NURALON 4 0 TR CR/8 (SUTURE) ×4 IMPLANT
SUT PROLENE 5 0 RB 2 (SUTURE) ×2 IMPLANT
SUT PROLENE 6 0 BV (SUTURE) IMPLANT
SUT VIC AB 1 CT1 18XBRD ANBCTR (SUTURE) ×1 IMPLANT
SUT VIC AB 1 CT1 8-18 (SUTURE) ×1
SUT VIC AB 2-0 CT1 18 (SUTURE) ×2 IMPLANT
SUT VIC AB 3-0 SH 8-18 (SUTURE) IMPLANT
TOWEL GREEN STERILE (TOWEL DISPOSABLE) ×2 IMPLANT
TOWEL GREEN STERILE FF (TOWEL DISPOSABLE) IMPLANT
TRAY FOLEY MTR SLVR 16FR STAT (SET/KITS/TRAYS/PACK) IMPLANT
UNDERPAD 30X36 HEAVY ABSORB (UNDERPADS AND DIAPERS) IMPLANT
WATER STERILE IRR 1000ML POUR (IV SOLUTION) ×2 IMPLANT

## 2021-02-16 NOTE — H&P (Signed)
Providing Compassionate, Quality Care - Together  NEUROSURGERY HISTORY & PHYSICAL   Beth Key is an 20 y.o. female.   Chief Complaint: HA with numbness and tingling, blurry vision HPI: This is a 20 year old female with a history of Chiari I malformation has had progressive worsening bilateral headaches, episodes of numbness tingling in arms and legs as well as blurry vision that is become more frequent.  She denies any difficulty swallowing, bowel or bladder changes.  She does have tussive headaches that are worse.  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  She presents today for suboccipital Chiari decompression with duraplasty.  Past Medical History:  Diagnosis Date   Acute pancreatitis 10/08/2013   ADHD (attention deficit hyperactivity disorder)    Anxiety    Asthma    Last asthmatic episode approx. 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 self-diagnosis   Gallstones 10/15/2013   GERD (gastroesophageal reflux disease)    Headache(784.0)    Symptomatic cholelithiasis 12/31/2013    Past Surgical History:  Procedure Laterality Date   CHOLECYSTECTOMY N/A 12/31/2013   Procedure: LAPAROSCOPIC CHOLECYSTECTOMY ;  Surgeon: Judie Petit. Leonia Corona, MD;  Location: [facility] OR;  Service: Pediatrics;  Laterality: N/A;   WISDOM TOOTH EXTRACTION      Family History  Adopted: Yes  Problem Relation Age of Onset   Alcohol abuse Mother    Depression Mother    Drug abuse Mother    Heart disease Maternal Grandmother    Hypertension Maternal Grandmother    Depression Maternal Grandmother    Heart disease Maternal Aunt    Hypertension Maternal Aunt    Depression Maternal Aunt    Social History:  reports that she has been smoking e-cigarettes. She has never used smokeless tobacco. She reports that she does not currently use alcohol. She reports current drug use. Drug: Marijuana.  Allergies:  Allergies  Allergen  Reactions   Penicillins Rash    Has patient had a PCN reaction causing immediate rash, facial/tongue/throat swelling, SOB or lightheadedness with hypotension: Yes Has patient had a PCN reaction causing severe rash involving mucus membranes or skin necrosis: No Has patient had a PCN reaction that required hospitalization: No Has patient had a PCN reaction occurring within the last 10 years: No If all of the above answers are "NO", then may proceed with Cephalosporin use.    Promethazine Other (See Comments)    Dystonic reaction to IV Promethazine (jerky movements)   Reglan [Metoclopramide]     Made her jittery    Medications Prior to Admission  Medication Sig Dispense Refill   aspirin-acetaminophen-caffeine (EXCEDRIN MIGRAINE) 250-250-65 MG tablet Take 2 tablets by mouth daily as needed for headache or migraine.     PROAIR HFA 108 (90 Base) MCG/ACT inhaler Inhale 2 puffs into the lungs every 6 (six) hours as needed for wheezing or shortness of breath.     pantoprazole (PROTONIX) 20 MG tablet Take 1 tablet (20 mg total) by mouth daily for 14 days. (Patient not taking: No sig reported) 14 tablet 0   vitamin B-12 (CYANOCOBALAMIN) 250 MCG tablet Take 250 mcg by mouth in the morning. (Patient not taking: Reported on 02/12/2021)     Vitamin D3 (VITAMIN D) 25 MCG tablet Take 1,000 Units by mouth in the morning. (Patient not taking: Reported on 02/12/2021)      Results for orders placed or performed during the hospital encounter  of 02/16/21 (from the past 48 hour(s))  SARS Coronavirus 2 by RT PCR (hospital order, performed in [facility] hospital lab) Nasopharyngeal Nasopharyngeal Swab     Status: None   Collection Time: 02/16/21  5:46 AM   Specimen: Nasopharyngeal Swab  Result Value Ref Range   SARS Coronavirus 2 NEGATIVE NEGATIVE    Comment: (NOTE) SARS-CoV-2 target nucleic acids are NOT DETECTED.  The SARS-CoV-2 RNA is generally detectable in upper and lower respiratory specimens during the  acute phase of infection. The lowest concentration of SARS-CoV-2 viral copies this assay can detect is 250 copies / mL. A negative result does not preclude SARS-CoV-2 infection and should not be used as the sole basis for treatment or other patient management decisions.  A negative result may occur with improper specimen collection / handling, submission of specimen other than nasopharyngeal swab, presence of viral mutation(s) within the areas targeted by this assay, and inadequate number of viral copies (<250 copies / mL). A negative result must be combined with clinical observations, patient history, and epidemiological information.  Fact Sheet for Patients:   BoilerBrush.com.cy  Fact Sheet for Healthcare Providers: https://pope.com/  This test is not yet approved or  cleared by the [place] FDA and has been authorized for detection and/or diagnosis of SARS-CoV-2 by FDA under an Emergency Use Authorization (EUA).  This EUA will remain in effect (meaning this test can be used) for the duration of the COVID-19 declaration under Section 564(b)(1) of the Act, 21 U.S.C. section 360bbb-3(b)(1), unless the authorization is terminated or revoked sooner.  Performed at [facility], [address], [place], [place] [address]   Pregnancy, urine POC     Status: None   Collection Time: 02/16/21  6:25 AM  Result Value Ref Range   Preg Test, Ur NEGATIVE NEGATIVE    Comment:        THE SENSITIVITY OF THIS METHODOLOGY IS >24 mIU/mL   Type and screen [facility]     Status: None (Preliminary result)   Collection Time: 02/16/21  6:48 AM  Result Value Ref Range   ABO/RH(D) PENDING    Antibody Screen PENDING    Sample Expiration      02/19/2021,2359 Performed at [facility], [address], [place], [place] [address]    No results found.  ROS All positives and negatives are listed HPI above  Blood  pressure 119/77, pulse 84, temperature 98.8 F (37.1 C), temperature source Oral, resp. rate 18, height 5\' 9"  (1.753 m), weight 90.7 kg, last menstrual period 01/23/2021, SpO2 98 %. Physical Exam  Awake alert oriented x3, mild distress PERRLA EOMI Cranial nerves II through XII intact Bilateral upper/lower extremity 5/5 throughout SILT  Assessment/Plan 20 year old female with  Chiari I malformation with cervical syrinx  -OR today for suboccipital craniectomy, duraplasty and C1 laminectomy.  We discussed all risks, benefits and expected outcomes and the patient agrees to proceed.   Thank you for allowing me to participate in this patient's care.  Please do not hesitate to call with questions or concerns.   21, DO Neurosurgeon [facility] Cell: [phone]

## 2021-02-16 NOTE — Anesthesia Procedure Notes (Signed)
Arterial Line Insertion Start/End9/27/2022 7:54 AM Performed by: Dorie Rank, CRNA, CRNA  Patient location: OR. Preanesthetic checklist: patient identified, IV checked, site marked, risks and benefits discussed, surgical consent, monitors and equipment checked, pre-op evaluation, timeout performed and anesthesia consent Lidocaine 1% used for infiltration and patient sedated Left, radial was placed Catheter size: 20 G Hand hygiene performed  and maximum sterile barriers used   Attempts: 1 Procedure performed without using ultrasound guided technique. Following insertion, dressing applied and Biopatch. Post procedure assessment: normal  Patient tolerated the procedure well with no immediate complications. Additional procedure comments: 2 attempts on R radial with resistance passing wire.

## 2021-02-16 NOTE — Anesthesia Procedure Notes (Signed)
Procedure Name: Intubation Date/Time: 02/16/2021 7:41 AM Performed by: Rosine Door, RN Pre-anesthesia Checklist: Patient identified, Emergency Drugs available, Suction available and Patient being monitored Patient Re-evaluated:Patient Re-evaluated prior to induction Oxygen Delivery Method: Circle system utilized Preoxygenation: Pre-oxygenation with 100% oxygen Induction Type: IV induction Ventilation: Mask ventilation without difficulty Laryngoscope Size: Mac and 3 Grade View: Grade I Tube type: Oral Tube size: 7.0 mm Number of attempts: 1 Airway Equipment and Method: Stylet Placement Confirmation: ETT inserted through vocal cords under direct vision, positive ETCO2 and breath sounds checked- equal and bilateral Secured at: 22 cm Tube secured with: Tape Dental Injury: Teeth and Oropharynx as per pre-operative assessment  Comments: Atraumatic placement. 22 at lip

## 2021-02-16 NOTE — Progress Notes (Signed)
   Providing Compassionate, Quality Care - Together  NEUROSURGERY PROGRESS NOTE   S: pt s/e in pacu  O: EXAM:  BP 116/78   Pulse 70   Temp 98.2 F (36.8 C) (Oral)   Resp 15   Ht 5\' 9"  (1.753 m)   Wt 90.7 kg   LMP 01/23/2021   SpO2 98%   BMI 29.53 kg/m   Awake, alert PERRL Face symmetric CNs grossly intact  5/5 BUE/BLE  Dressing c/d/i  ASSESSMENT:  20 y.o. female with   Chiari 1 malformation with syrinx  PLAN: - icu -pain control -pt/ot -neuro checks    Thank you for allowing me to participate in this patient's care.  Please do not hesitate to call with questions or concerns.   21, DO Neurosurgeon [facility] Cell: [phone]

## 2021-02-16 NOTE — Anesthesia Postprocedure Evaluation (Signed)
Anesthesia Post Note  Patient: Beth Key  Procedure(s) Performed: CHIARI DECOMPRESSION WITH Cervical one LAMINECTOMY AND DURAPLASTY     Patient location during evaluation: PACU Anesthesia Type: General Level of consciousness: sedated and patient cooperative Pain management: pain level controlled Vital Signs Assessment: post-procedure vital signs reviewed and stable Respiratory status: spontaneous breathing Cardiovascular status: stable Anesthetic complications: no   No notable events documented.  Last Vitals:  Vitals:   02/16/21 1315 02/16/21 1400  BP: 114/76 116/78  Pulse:  70  Resp:  15  Temp: 36.8 C   SpO2:  98%    Last Pain:  Vitals:   02/16/21 1437  TempSrc:   PainSc: 8                  Lewie Loron

## 2021-02-16 NOTE — Progress Notes (Signed)
Pharmacy Antibiotic Note  Beth Key is a 20 y.o. female admitted on 02/16/2021 for planned suboccipital chiari decompression with duraplasty. Pharmacy has been consulted for Vancomycin post-op dosing for 24 hours for surgical prophylaxis.   Vancomycin 1g given pre-op at 0700. SCr 0.71, CrCl>100 ml/min.   Plan: - Vancomycin 1g IV every 12 hours x 2 doses - Pharmacy will sign off as no further doses expected at this time   Height: 5\' 9"  (175.3 cm) Weight: 90.7 kg (200 lb) IBW/kg (Calculated) : 66.2  Temp (24hrs), Avg:98.3 F (36.8 C), Min:97.7 F (36.5 C), Max:98.8 F (37.1 C)  Recent Labs  Lab 02/16/21 0717  WBC 7.9  CREATININE 0.71    Estimated Creatinine Clearance: 132.3 mL/min (by C-G formula based on SCr of 0.71 mg/dL).    Allergies  Allergen Reactions   Penicillins Rash    Has patient had a PCN reaction causing immediate rash, facial/tongue/throat swelling, SOB or lightheadedness with hypotension: Yes Has patient had a PCN reaction causing severe rash involving mucus membranes or skin necrosis: No Has patient had a PCN reaction that required hospitalization: No Has patient had a PCN reaction occurring within the last 10 years: No If all of the above answers are "NO", then may proceed with Cephalosporin use.    Promethazine Other (See Comments)    Dystonic reaction to IV Promethazine (jerky movements)   Reglan [Metoclopramide]     Made her jittery    Thank you for allowing pharmacy to be a part of this patient's care.  02/18/21, PharmD, BCPS Clinical Pharmacist Clinical phone for 02/16/2021: [phone] 02/16/2021 2:23 PM   **Pharmacist phone directory can now be found on [email or web address] ([email or web address]).  Listed under [facility].

## 2021-02-16 NOTE — Transfer of Care (Signed)
Immediate Anesthesia Transfer of Care Note  Patient: Beth Key  Procedure(s) Performed: CHIARI DECOMPRESSION WITH Cervical one LAMINECTOMY AND DURAPLASTY  Patient Location: PACU  Anesthesia Type:General  Level of Consciousness: awake, alert  and oriented  Airway & Oxygen Therapy: Patient Spontanous Breathing  Post-op Assessment: Report given to RN and Post -op Vital signs reviewed and stable  Post vital signs: Reviewed and stable  Last Vitals:  Vitals Value Taken Time  BP 111/70 02/16/21 1142  Temp    Pulse 73 02/16/21 1144  Resp 13 02/16/21 1144  SpO2 100 % 02/16/21 1144  Vitals shown include unvalidated device data.  Last Pain:  Vitals:   02/16/21 0630  TempSrc:   PainSc: 0-No pain         Complications: No notable events documented.

## 2021-02-17 ENCOUNTER — Encounter (HOSPITAL_COMMUNITY): Payer: Self-pay | Admitting: Neurological Surgery

## 2021-02-17 MED ORDER — DIPHENHYDRAMINE HCL 25 MG PO CAPS
25.0000 mg | ORAL_CAPSULE | Freq: Three times a day (TID) | ORAL | Status: DC | PRN
  Administered 2021-02-17 – 2021-02-18 (×2): 25 mg via ORAL
  Filled 2021-02-17 (×2): qty 1

## 2021-02-17 MED ORDER — PROCHLORPERAZINE EDISYLATE 10 MG/2ML IJ SOLN
10.0000 mg | Freq: Four times a day (QID) | INTRAMUSCULAR | Status: DC | PRN
  Administered 2021-02-17: 10 mg via INTRAVENOUS
  Filled 2021-02-17: qty 2

## 2021-02-17 NOTE — Progress Notes (Signed)
Subjective: Patient reports nausea overnight that was refractory to Zofran. She received compazine and reports "it made me feel real jittery." Nausea has resolved, but the "jitteriness" persists. She has posterior neck pain with movement.   Objective: Vital signs in last 24 hours: Temp:  [97.6 F (36.4 C)-98.8 F (37.1 C)] 98.5 F (36.9 C) (09/28 0735) Pulse Rate:  [70-103] 83 (09/28 0900) Resp:  [11-24] 11 (09/28 0900) BP: (98-141)/(54-86) 109/72 (09/28 0600) SpO2:  [96 %-100 %] 100 % (09/28 0900) Arterial Line BP: (116-164)/(57-85) 125/80 (09/28 0900)  Intake/Output from previous day: 09/27 0701 - 09/28 0700 In: 2467.5 [I.V.:2267.5; IV Piggyback:200.1] Out: 2620 [Urine:2420; Blood:200] Intake/Output this shift: Total I/O In: 497.2 [I.V.:297.2; IV Piggyback:200] Out: 150 [Urine:150]  Physical Exam: Patient is awake, A/O X 4, and conversant. They are in NAD and VSS. Doing well. Speech is fluent and appropriate. MAEW with good strength that is symmetric bilaterally. Sensation to light touch is intact. PERLA, EOMI. CNs grossly intact. Dressing is clean dry intact. Incision is well approximated with no drainage, erythema, or edema.     Lab Results: Recent Labs    02/16/21 0717 02/16/21 0834  WBC 7.9  --   HGB 13.3 10.5*  HCT 42.4 31.0*  PLT 362  --    BMET Recent Labs    02/16/21 0717 02/16/21 0834  NA 135 140  K 3.7 3.4*  CL 106  --   CO2 21*  --   GLUCOSE 98  --   BUN 11  --   CREATININE 0.71  --   CALCIUM 8.9  --     Studies/Results: No results found.  Assessment/Plan: Patient is post-op day 1 s/p suboccipital chiari decompression with duraplasty. She is recovering well. She had c/o nausea overnight that was refractory to Zofran and required a dose of compazine. She likely had an allergic reaction to the compazine and is what is causing her "jitteriness." We will giver her a dose of Benadryl. Nausea has resolved. She has posterior neck pain with movement. No  complaints of headaches. She is at her neurological baseline.    -D/C foley -D/C A-line -Q4H neuro checks -Mobilize as tolerated   LOS: 1 day     Council Mechanic, DNP, NP-C 02/17/2021, 9:29 AM

## 2021-02-17 NOTE — Progress Notes (Signed)
Complains of nausea despite Zofran. Zofran given 2x this shift with little effect. Notified Nundkumar MD. Verbal order for Compazine PRN obtained.  Compazine PRN Ok'd by pharmacist.

## 2021-02-17 NOTE — Op Note (Signed)
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 Kendell Bane C. Meerab Maselli, DO  ASSISTANT: Dr. Lisbeth Renshaw, MD  SECOND ASSISTANT: Docia Barrier, NP  ANESTHESIA: General Endotracheal  EBL: 50 cc  SPECIMENS: None  DRAINS: None  COMPLICATIONS: None  CONDITION: Hemodynamically stable  HISTORY: Beth Key is a 20 y.o. female that presented with headaches, numbness tingling, blurry vision and was found to have an MRI a Chiari I malformation with significant tonsillar herniation and crowding of the craniocervical junction.  Upon MRI of the cervical spine she was found to have a large syrinx in her lower cervical spine.  She had progressive symptoms over the past 6 to 8 months with worsening headaches, blurry vision and numbness tingling episodes.  Her headaches were quite severe with tussive episodes.  Given this, I discussed with her about surgical intervention in the form of a suboccipital craniectomy, duraplasty for her Chiari malformation.  All risks, benefits and expected outcomes were discussed and agreed upon with the patient.  PROCEDURE IN DETAIL: The patient was brought to the operating room. After induction of general anesthesia, the patient's head was fixed in a Sugita head holder, the patient was positioned on the operative table in the prone position. All pressure points were meticulously padded.  The suboccipital region was clipped free of hair.  Skin incision was then marked out and prepped and draped in the usual sterile fashion.  Using a 10 blade, midline incision was created from the inion to C2.  Soft tissue dissection was performed Bovie electrocautery down to the spinous  process of C2 and the suboccipital region.  The suboccipital muscles were elevated off of C1 lamina bilaterally and the suboccipital region bilaterally.  Deep retractors were placed in the wound.  At this point the microscope was sterilely draped and brought into the field for the remainder of the procedure.  Using high-speed drill, bilateral C1 laminectomy was performed creating troughs bilaterally.  This was gently elevated and dissected off surrounding ligamentous attachments.  Using high-speed drill, suboccipital craniectomy was performed of approximately 3 cm x 3 cm.  The bone was gently elevated off.  The craniocervical ligamentous bands were ligated and cut with bipolar cautery and scissors.  The dura was identified.  Epidural hemostasis was achieved with Surgifoam.  Using a 15 blade, the dura was then opened just above C2 at midline this was then carefully extended superiorly until Y durotomy fashion.  The bilateral tonsils were identified, the right was significantly enlarged and herniated through foramen magnum.  Once a wide durotomy was created, an appropriate size Dura-Guard patch was measured.  Using 5-0 Prolene suture this was tacked down in the 3 corners.  Then using 5-0 Prolene suture this was completed using running fashion.  Once completed, Valsalva maneuver was performed for greater than 10 seconds and there was no significant CSF egress.  A supplemental suture was placed at the inferior durotomy using 5-0 Prolene suture.  Again Valsalva was performed and there was no significant CSF egress. Adherus glue was placed around the cranioplasty site.  Deep retractors were taken out of the wound.  Hemostasis was achieved with Surgiflo and bipolar cautery.  The wound was noted to be excellently hemostatic.  The wound was then closed in layers with 0 Vicryl  suture for muscle and fascia.  2-0 and 3-0 Vicryl suture for dermis.  The skin was closed with skin glue and sterile dressing was applied.  At the  end of the case all sponge, needle, and instrument counts were correct. The patient was then transferred to the stretcher, Sugita head holder removed, pin sites were stable, she was extubated, and taken to the post-anesthesia care unit in stable hemodynamic condition.

## 2021-02-18 NOTE — Progress Notes (Addendum)
Subjective: Patient reports that she is feeling significantly better today as compared to yesterday. She has has no more c/o nausea. Pain is reduced and well controlled. No acute events overnight.  Objective: Vital signs in last 24 hours: Temp:  [98.4 F (36.9 C)-98.9 F (37.2 C)] 98.4 F (36.9 C) (09/29 0800) Pulse Rate:  [67-106] 86 (09/29 0900) Resp:  [7-30] 20 (09/29 0900) BP: (102-145)/(69-103) 119/84 (09/29 0900) SpO2:  [94 %-100 %] 97 % (09/29 0900) Arterial Line BP: (116-124)/(78-108) 116/78 (09/28 1100)  Intake/Output from previous day: 09/28 0701 - 09/29 0700 In: 1512.9 [I.V.:1312.9; IV Piggyback:200] Out: 1300 [Urine:1300] Intake/Output this shift: Total I/O In: 90.5 [I.V.:90.5] Out: -   Physical Exam: Patient is awake, sitting comfortably in her bedside chair in NAD. She is A/O X 4, and conversant. VSS. Doing well. Speech is fluent and appropriate. MAEW with good strength that is symmetric bilaterally. Sensation to light touch is intact. PERLA, EOMI. CNs grossly intact. Dressing is clean dry intact. Incision is well approximated with no drainage, erythema, or edema.  Lab Results: Recent Labs    02/16/21 0717 02/16/21 0834  WBC 7.9  --   HGB 13.3 10.5*  HCT 42.4 31.0*  PLT 362  --    BMET Recent Labs    02/16/21 0717 02/16/21 0834  NA 135 140  K 3.7 3.4*  CL 106  --   CO2 21*  --   GLUCOSE 98  --   BUN 11  --   CREATININE 0.71  --   CALCIUM 8.9  --     Studies/Results: No results found.  Assessment/Plan: Patient is post-op day 2 s/p suboccipital chiari decompression with duraplasty. She is continuing to recover well and has significantly improved from yesterday. Nausea continues to be resolved. She has posterior neck pain with movement that is improving. No complaints of headaches. She is at her neurological baseline.    -Q4H neuro checks -Mobilize as tolerated -Possible discharge tomorrow   LOS: 2 days     Council Mechanic, DNP,  NP-C 02/18/2021, 9:29 AM   Addendum  Pt s/e  Agree with above

## 2021-02-19 MED ORDER — HYDROCODONE-ACETAMINOPHEN 5-325 MG PO TABS: 1.0000 | ORAL_TABLET | ORAL | 0 refills | Status: AC | PRN

## 2021-02-19 NOTE — Discharge Summary (Signed)
  Physician Discharge Summary  Patient ID: Beth Key MRN: [id number] DOB/AGE: 12-Jan-2001 20 y.o.  Admit date: 02/16/2021 Discharge date: 02/19/2021  Admission Diagnoses:  Chiari I malformation with cervical syrinx  Discharge Diagnoses:  Same Active Problems:   Chiari I malformation (HCC)   Chiari malformation type I [name]   Discharged Condition: Stable  Hospital Course:  Beth Key is a 20 y.o. female that underwent elective suboccipital craniectomy, C1 laminectomy with duraplasty on 02/16/2021.  She tolerated the surgery well.  Postoperatively she was monitored in the ICU and was at her neurologic baseline.  Her pain was controlled on oral medication, she was having normal bowel bladder function and at her neurologic baseline with improvement in her numbness and tingling and headaches upon discharge.  She was ambulating independently.  Her incision was clean dry and intact, healing well.  There is no active drainage.  Treatments: Surgery -suboccipital craniectomy, C1 laminectomy, duraplasty  Discharge Exam: Blood pressure 118/73, pulse 90, temperature 98.4 F (36.9 C), temperature source Oral, resp. rate 14, height 5\' 9"  (1.753 m), weight 90.7 kg, last menstrual period 01/23/2021, SpO2 98 %. Awake, alert, oriented x3 PERRLA Speech fluent, appropriate CN grossly intact 5/5 BUE/BLE Wound c/d/i  Disposition: Discharge disposition: 01-Home or Self Care        Allergies as of 02/19/2021       Reactions   Penicillins Rash   Has patient had a PCN reaction causing immediate rash, facial/tongue/throat swelling, SOB or lightheadedness with hypotension: Yes Has patient had a PCN reaction causing severe rash involving mucus membranes or skin necrosis: No Has patient had a PCN reaction that required hospitalization: No Has patient had a PCN reaction occurring within the last 10 years: No If all of the above answers are "NO", then may proceed with Cephalosporin use.    Promethazine Other (See Comments)   Dystonic reaction to IV Promethazine (jerky movements)   Reglan [metoclopramide]    Made her jittery        Medication List     TAKE these medications    aspirin-acetaminophen-caffeine 250-250-65 MG tablet Commonly known as: EXCEDRIN MIGRAINE Take 2 tablets by mouth daily as needed for headache or migraine.   HYDROcodone-acetaminophen 5-325 MG tablet Commonly known as: NORCO/VICODIN Take 1 tablet by mouth every 4 (four) hours as needed for moderate pain.   pantoprazole 20 MG tablet Commonly known as: PROTONIX Take 1 tablet (20 mg total) by mouth daily for 14 days.   ProAir HFA 108 (90 Base) MCG/ACT inhaler Generic drug: albuterol Inhale 2 puffs into the lungs every 6 (six) hours as needed for wheezing or shortness of breath.   vitamin B-12 250 MCG tablet Commonly known as: CYANOCOBALAMIN Take 250 mcg by mouth in the morning.   Vitamin D3 25 MCG tablet Commonly known as: Vitamin D Take 1,000 Units by mouth in the morning.         Signed10/06/2020 Carroll Ranney 02/19/2021, 8:52 AM

## 2021-02-19 NOTE — Progress Notes (Signed)
Subjective: Patient reports that she is continuing to recover well. She is ready to be discharged home. No acute events overnight.   Objective: Vital signs in last 24 hours: Temp:  [98.4 F (36.9 C)-98.5 F (36.9 C)] 98.4 F (36.9 C) (09/29 1956) Pulse Rate:  [65-105] 90 (09/30 0800) Resp:  [11-23] 14 (09/30 0800) BP: (99-130)/(63-94) 118/73 (09/30 0800) SpO2:  [96 %-100 %] 98 % (09/30 0800)  Intake/Output from previous day: 09/29 0701 - 09/30 0700 In: 1183.7 [P.O.:100; I.V.:1083.7] Out: -  Intake/Output this shift: No intake/output data recorded.  Physical Exam: Patient is awake, sitting comfortably in her bedside chair in NAD. She is A/O X 4, and conversant. VSS. Doing well. Speech is fluent and appropriate. MAEW with good strength that is symmetric bilaterally. Sensation to light touch is intact. PERLA, EOMI. CNs grossly intact. Dressing is clean dry intact. Incision is well approximated with no drainage, erythema, or edema.  Lab Results: No results for input(s): WBC, HGB, HCT, PLT in the last 72 hours. BMET No results for input(s): NA, K, CL, CO2, GLUCOSE, BUN, CREATININE, CALCIUM in the last 72 hours.  Studies/Results: No results found.  Assessment/Plan: Patient is post-op day 3 s/p suboccipital chiari decompression with duraplasty. She is continuing to recover well and and is at her neurological baseline. Nausea continues to be resolved. She has posterior neck pain with movement that is continuing to improve. No complaints of headaches. She is stable and appropriate for discharge today.   -Q4H neuro checks -Mobilize as tolerated -Plan for discharge today  LOS: 3 days     Council Mechanic, DNP, NP-C 02/19/2021, 8:47 AM

## 2021-02-19 NOTE — Progress Notes (Signed)
D/C education given to Pt and all questions answered. No printed prescriptions to give or equipment to deliver. IV removed. Pt taken to car with all belongings.   

## 2021-05-20 ENCOUNTER — Other Ambulatory Visit (HOSPITAL_COMMUNITY): Payer: Self-pay | Admitting: Neurological Surgery

## 2021-05-20 ENCOUNTER — Other Ambulatory Visit: Payer: Self-pay | Admitting: Neurological Surgery

## 2021-05-20 DIAGNOSIS — G935 Compression of brain: Secondary | ICD-10-CM

## 2021-05-21 ENCOUNTER — Other Ambulatory Visit: Payer: Self-pay

## 2021-05-21 ENCOUNTER — Ambulatory Visit (HOSPITAL_COMMUNITY)
Admission: RE | Admit: 2021-05-21 | Discharge: 2021-05-21 | Disposition: A | Discharge status: Home / Self Care | Payer: Medicaid Other | Source: Ambulatory Visit | Attending: Neurological Surgery | Admitting: Neurological Surgery

## 2021-05-21 DIAGNOSIS — G935 Compression of brain: Secondary | ICD-10-CM | POA: Diagnosis not present

## 2021-06-24 ENCOUNTER — Ambulatory Visit: Payer: Medicaid Other | Attending: Neurological Surgery | Admitting: Physical Therapy

## 2021-06-24 DIAGNOSIS — G935 Compression of brain: Secondary | ICD-10-CM | POA: Insufficient documentation

## 2021-06-24 DIAGNOSIS — R293 Abnormal posture: Secondary | ICD-10-CM | POA: Insufficient documentation

## 2021-06-24 DIAGNOSIS — M542 Cervicalgia: Secondary | ICD-10-CM | POA: Insufficient documentation

## 2021-06-24 DIAGNOSIS — M6281 Muscle weakness (generalized): Secondary | ICD-10-CM | POA: Insufficient documentation

## 2021-06-30 NOTE — Therapy (Incomplete)
OUTPATIENT PHYSICAL THERAPY CERVICAL EVALUATION   Patient Name: Beth Key MRN: [id number] DOB:November 12, 2000, 21 y.o., female Today's Date: 06/30/2021    Past Medical History:  Diagnosis Date   Acute pancreatitis 10/08/2013   ADHD (attention deficit hyperactivity disorder)    Anxiety    Asthma    Last asthmatic episode approx. 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 self-diagnosis   Gallstones 10/15/2013   GERD (gastroesophageal reflux disease)    Headache(784.0)    Symptomatic cholelithiasis 12/31/2013   Past Surgical History:  Procedure Laterality Date   CHOLECYSTECTOMY N/A 12/31/2013   Procedure: LAPAROSCOPIC CHOLECYSTECTOMY ;  Surgeon: Judie Petit. 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  ONSET DATE: 02/16/21 (date of surgical decompression)  SUBJECTIVE:                                                                                                                                                                                                          SUBJECTIVE STATEMENT: Pt is referred to [facility] s/p surgical decompression of Chiari I malformation performed 02/16/21.  She underwent elective suboccipital craniectomy, C1 laminectomy with duraplasty.  She is specifically referred for neck strengthening and ROM exercises.  PERTINENT HISTORY:  Chiari I malformation with history of decompression surgery of C1 with duraplasty 01/2021  PAIN:  Are you having pain? {yes/no:20286} NPRS scale: ***/10 Pain location: *** Pain orientation: {Pain Orientation:25161}  PAIN TYPE: {type:313116} Pain description: {PAIN DESCRIPTION:21022940}  Aggravating factors: *** Relieving factors: ***  PRECAUTIONS: Cervical, Hx of C1 laminectomy with duraplasty  WEIGHT BEARING RESTRICTIONS No  FALLS:  Has patient fallen in last 6 months? {yes/no:20286} Number of falls: ***  LIVING ENVIRONMENT: Lives with: {[facility] lives with:25569::"lives with their family"} Lives in: {Lives in:25570} Stairs: {yes/no:20286}; {Stairs:24000} Has following equipment at home: {Assistive devices:23999}  OCCUPATION: ***  PLOF: {PLOF:24004}  PATIENT GOALS ***  OBJECTIVE:   DIAGNOSTIC FINDINGS:  CT Scan 04/2021 post-operatively - low lying cerebellar tonsils with improved crowding  PATIENT SURVEYS:  {rehab surveys:24030}   COGNITION: Overall cognitive status: {cognition:24006}   SENSATION: Light touch: {intact/deficits:24005} Hot/Cold: {intact/deficits:24005} Proprioception: {intact/deficits:24005}  POSTURE:  ***  PALPATION: ***   CERVICAL AROM/PROM  A/PROM A/PROM (deg) 06/30/2021  Flexion   Extension   Right lateral flexion   Left lateral flexion   Right rotation   Left rotation    (Blank rows = not tested)  UE AROM:  CERVICAL RESISTED ISOMETRICS:   UE MMT:  MMT Right 06/30/2021 Left 06/30/2021  Shoulder flexion    Shoulder extension    Shoulder abduction    Shoulder adduction    Shoulder extension     Shoulder internal rotation    Shoulder external rotation    Middle trapezius    Lower trapezius    Elbow flexion    Elbow extension    Wrist flexion    Wrist extension    Wrist ulnar deviation    Wrist radial deviation    Wrist pronation    Wrist supination    Grip strength     (Blank rows = not tested)     FUNCTIONAL TESTS:  {Functional tests:24029}    TODAY'S TREATMENT:  ***   PATIENT EDUCATION:  Education details: *** Person educated: {Person educated:25204} Education method: {Education Method:25205} Education comprehension: {Education Comprehension:25206}   HOME EXERCISE PROGRAM: ***  ASSESSMENT:  CLINICAL IMPRESSION: Patient is a 21 y.o. female who was seen today for physical therapy evaluation and treatment for Chiari I malformation, post-operatively following decompression C1 laminectoy and duraplasty. Objective impairments include {opptimpairments:25111}. These impairments are limiting patient from {activity limitations:25113}. Personal factors including {Personal factors:25162} are also affecting patient's functional outcome. Patient will benefit from skilled PT to address above impairments and improve overall function.  REHAB POTENTIAL: {rehabpotential:25112}  CLINICAL DECISION MAKING: {clinical decision making:25114}  EVALUATION COMPLEXITY: {Evaluation complexity:25115}   GOALS: Goals reviewed with patient? {yes/no:20286}  SHORT TERM GOALS:  STG Name Target Date Goal status  1 *** Baseline:  {follow up:25551} {GOALSTATUS:25110}  2 *** Baseline:  {follow up:25551} {GOALSTATUS:25110}  3 *** Baseline: {follow up:25551} {GOALSTATUS:25110}  4 *** Baseline: {follow up:25551} {GOALSTATUS:25110}  5 *** Baseline: {follow up:25551} {GOALSTATUS:25110}  6 *** Baseline: {follow up:25551} {GOALSTATUS:25110}  7 *** Baseline: {follow up:25551} {GOALSTATUS:25110}   LONG TERM GOALS:   LTG Name Target Date Goal status  1 *** Baseline: {follow  up:25551} {GOALSTATUS:25110}  2 *** Baseline: {follow up:25551} {GOALSTATUS:25110}  3 *** Baseline: {follow up:25551} {GOALSTATUS:25110}  4 *** Baseline: {follow up:25551} {GOALSTATUS:25110}  5 *** Baseline: {follow up:25551} {GOALSTATUS:25110}  6 *** Baseline: {follow up:25551} {GOALSTATUS:25110}  7 *** Baseline: {follow up:25551} {GOALSTATUS:25110}   PLAN: PT FREQUENCY: {rehab frequency:25116}  PT DURATION: {rehab duration:25117}  PLANNED INTERVENTIONS: {rehab planned interventions:25118::"Therapeutic exercises","Therapeutic activity","Neuro Muscular re-education","Balance training","Gait training","Patient/Family education","Joint mobilization"}  PLAN FOR NEXT SESSION: ***   Kaedan Richert E Eisa Conaway, PT 06/30/2021, 6:17 PM

## 2021-07-01 ENCOUNTER — Other Ambulatory Visit: Payer: Self-pay

## 2021-07-01 ENCOUNTER — Ambulatory Visit: Payer: Medicaid Other | Admitting: Physical Therapy

## 2021-07-01 ENCOUNTER — Ambulatory Visit: Payer: Medicaid Other

## 2021-07-01 DIAGNOSIS — M542 Cervicalgia: Secondary | ICD-10-CM

## 2021-07-01 DIAGNOSIS — G935 Compression of brain: Secondary | ICD-10-CM | POA: Diagnosis present

## 2021-07-01 DIAGNOSIS — M6281 Muscle weakness (generalized): Secondary | ICD-10-CM

## 2021-07-01 DIAGNOSIS — R293 Abnormal posture: Secondary | ICD-10-CM | POA: Diagnosis not present

## 2021-07-01 NOTE — Therapy (Addendum)
OUTPATIENT PHYSICAL THERAPY CERVICAL EVALUATION   Patient Name: Beth Key MRN: [id number] DOB:February 13, 2001, 21 y.o., female Today's Date: 07/01/2021   PT End of Session - 07/01/21 1623     Visit Number 1    Authorization Type [place] - Visit Number 1    PT Start Time 1618    Activity Tolerance Patient tolerated treatment well    Behavior During Therapy [facility] for tasks assessed/performed             Past Medical History:  Diagnosis Date   Acute pancreatitis 10/08/2013   ADHD (attention deficit hyperactivity disorder)    Anxiety    Asthma    Last asthmatic episode approx. 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 self-diagnosis   Gallstones 10/15/2013   GERD (gastroesophageal reflux disease)    Headache(784.0)    Symptomatic cholelithiasis 12/31/2013   Past Surgical History:  Procedure Laterality Date   CHOLECYSTECTOMY N/A 12/31/2013   Procedure: LAPAROSCOPIC CHOLECYSTECTOMY ;  Surgeon: Jerilynn Mages. Gerald Stabs, MD;  Location: [place];  Service: Pediatrics;  Laterality: N/A;   SUBOCCIPITAL CRANIECTOMY CERVICAL LAMINECTOMY N/A 02/16/2021   Procedure: CHIARI DECOMPRESSION WITH Cervical one LAMINECTOMY AND DURAPLASTY;  Surgeon: Dawley, Theodoro Doing, DO;  Location: [place];  Service: Neurosurgery;  Laterality: N/A;   WISDOM TOOTH EXTRACTION     Patient Active Problem List   Diagnosis Date Noted   Chiari I malformation ([place]) 02/16/2021   Chiari malformation type I ([place]) 02/16/2021   Drug use affecting pregnancy 06/02/2017   Gastroesophageal reflux disease without esophagitis 10/30/2015   Panic disorder 07/15/2015   Substance abuse ([place]) 09/18/2014   Nicotine abuse 10/15/2013   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 10/15/2013   Disordered eating 10/15/2013   Menorrhagia with irregular cycle 10/08/2013    PCP: Barrie Lyme  REFERRING PROVIDER: Dawley, Theodoro Doing, DO  REFERRING DIAG: G93.5  (ICD-10-CM) - Chiari I malformation  THERAPY DIAG:  Cervicalgia  ONSET DATE: 06/22/21  SUBJECTIVE:                                                                                                                                                                                                         SUBJECTIVE STATEMENT: Patient states she had surgery to remove chiari malformation but had developed stiffness and pain prior to surgery.  Since surgery, she has continued to have pain and stiffness that comes and goes.  She describes this pain as intermittant  and as high as 8-9/10 and nauseating when present.  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  She has been trying to go to the gym with a friend but is not sure what would be safe to do.  She hopes to gain control of her pain and be able to begin a consistent exercise routine to avoid reoccurrence.   PERTINENT HISTORY:  Chiari malformation post surgical  PAIN:  Are you having pain? No NPRS scale: 8/10 Pain location: neck Pain orientation: Medial  PAIN TYPE: aching Pain description: intermittent  Aggravating factors: inconsistent Relieving factors: rest  PRECAUTIONS: None  WEIGHT BEARING RESTRICTIONS No  FALLS:  Has patient fallen in last 6 months? No Number of falls: 0   OCCUPATION: mom of 61 year old (stays at home)  PLOF: Independent  PATIENT GOALS To not hurt any more.   OBJECTIVE:   DIAGNOSTIC FINDINGS:  CT recently IMPRESSION: Low lying cerebellar tonsils status post interval suboccipital craniectomy with improved crowding. An MRI could better characterize if clinically indicated. No evidence of hydrocephalus or superimposed new acute abnormality  PATIENT SURVEYS:  FOTO 46   COGNITION: Overall cognitive status: Within functional limits for tasks assessed   SENSATION: Light touch: Appears intact   POSTURE:  Rounded shoulders, slight fwd head for age, most likely due to surgery to remove  chiari malformation  PALPATION: Tender to palpation and with soft tissue restriction bilateral upper traps.    CERVICAL AROM/PROM  A/PROM A/PROM (deg) 07/01/2021  Flexion WNL  Extension 25%  Right lateral flexion 75%  Left lateral flexion 75%  Right rotation 75%  Left rotation 75%   (Blank rows = not tested)  UE AROM/PROM:  WNL  WNL  UE MMT:  Generally 5- to 5/5 with exception of bilateral shoulder ER which was 4/5   TODAY'S TREATMENT:  Initial eval completed and HEP assigned   PATIENT EDUCATION:  Education details: HEP Person educated: Patient Education method: Consulting civil engineer, Demonstration, Verbal cues, and Handouts Education comprehension: verbalized understanding, returned demonstration, and verbal cues required   HOME EXERCISE PROGRAM: Access Code: [email or web address] URL: [email or web address] Date: 07/01/2021 Prepared by: Candyce Churn  Exercises Shoulder extension with resistance - Neutral - 2 x daily - 7 x weekly - 1 sets - 20 reps Standing Shoulder Row with Anchored Resistance - 2 x daily - 7 x weekly - 1 sets - 20 reps Shoulder External Rotation and Scapular Retraction with Resistance - 2 x daily - 7 x weekly - 1 sets - 20 reps Standing Shoulder Horizontal Abduction with Resistance - 2 x daily - 7 x weekly - 1 sets - 20 reps   ASSESSMENT:  CLINICAL IMPRESSION: Patient is a 21 y.o. female who was seen today for physical therapy evaluation and treatment for neck pain. Objective impairments include decreased ROM, decreased strength, hypomobility, increased fascial restrictions, increased muscle spasms, postural dysfunction, and pain. These impairments are limiting patient from cleaning, community activity, meal prep, laundry, shopping, and caring for her child . Personal factors including Age and Fitness are also affecting patient's functional outcome. Patient Key benefit from skilled PT to address above impairments and improve overall function.  REHAB  POTENTIAL: Good  CLINICAL DECISION MAKING: Stable/uncomplicated  EVALUATION COMPLEXITY: Low   GOALS: Goals reviewed with patient? Yes  SHORT TERM GOALS:  STG Name Target Date Goal status  1 Independence with initial HEP Baseline:  07/15/2021 INITIAL   LONG TERM GOALS:   LTG Name Target Date Goal status  1 Independence with advanced HEP Baseline: 07/29/2021 INITIAL  2 Patient to demonstrate 25% improvement in all cervical ROM deficits Baseline: 07/29/2021 INITIAL  3 Patient to report 75% improvement in overall symptoms Baseline: 07/29/2021 INITIAL  4 Patient to be able to care for her child and do household chores with increased ease.  Baseline: 07/29/2021 INITIAL   PLAN: Patient has no showed for all subsequent appointments.  We Key discharge at this time.  07-14-21 PHYSICAL THERAPY DISCHARGE SUMMARY  Visits from Start of Care: 1  Current functional level related to goals / functional outcomes: Unable to assess due to no showed for all subsequent appts.   Remaining deficits: Unable to assess due to no showed for all subsequent appts.   Education / Equipment: Unable to assess due to no showed for all subsequent appts.   Patient agrees to discharge. Patient goals were not met. Patient is being discharged due to not returning since the last visit.   PT FREQUENCY: 1-2x/week  PT DURATION: 4 weeks  PLANNED INTERVENTIONS: Therapeutic exercises, Therapeutic activity, Neuro Muscular re-education, Balance training, Gait training, Patient/Family education, Joint mobilization, Dry Needling, Electrical stimulation, Spinal mobilization, Cryotherapy, Moist heat, Taping, Ultrasound, and Manual therapy  PLAN FOR NEXT SESSION: Dry needling if with certified PT,  postural strengthening, UBE   Ramani Riva B. Holston Oyama, PT 07/14/2310:28 PM

## 2021-07-06 ENCOUNTER — Ambulatory Visit: Payer: Medicaid Other

## 2021-07-08 ENCOUNTER — Ambulatory Visit: Payer: Medicaid Other

## 2021-07-08 ENCOUNTER — Telehealth: Payer: Self-pay

## 2021-07-08 NOTE — Telephone Encounter (Signed)
Called and left voicemail for patient due to no-show appt today

## 2021-07-12 ENCOUNTER — Telehealth: Payer: Self-pay

## 2021-07-12 NOTE — Telephone Encounter (Signed)
Left message due to no-show appt today.   

## 2021-10-20 ENCOUNTER — Institutional Professional Consult (permissible substitution): Payer: Medicaid Other | Admitting: Plastic Surgery

## 2021-11-22 ENCOUNTER — Encounter (HOSPITAL_BASED_OUTPATIENT_CLINIC_OR_DEPARTMENT_OTHER): Payer: Self-pay | Admitting: Pediatrics

## 2021-11-22 ENCOUNTER — Emergency Department (HOSPITAL_BASED_OUTPATIENT_CLINIC_OR_DEPARTMENT_OTHER)
Admission: EM | Admit: 2021-11-22 | Discharge: 2021-11-22 | Disposition: A | Discharge status: Home / Self Care | Payer: Medicaid Other | Attending: Emergency Medicine | Admitting: Emergency Medicine

## 2021-11-22 ENCOUNTER — Emergency Department (HOSPITAL_BASED_OUTPATIENT_CLINIC_OR_DEPARTMENT_OTHER): Payer: Medicaid Other

## 2021-11-22 ENCOUNTER — Other Ambulatory Visit: Payer: Self-pay

## 2021-11-22 DIAGNOSIS — M129 Arthropathy, unspecified: Secondary | ICD-10-CM | POA: Insufficient documentation

## 2021-11-22 DIAGNOSIS — Z5321 Procedure and treatment not carried out due to patient leaving prior to being seen by health care provider: Secondary | ICD-10-CM | POA: Insufficient documentation

## 2021-11-22 NOTE — ED Triage Notes (Signed)
C/o swelling on right knee x 48 hrs ago; denies any recent injury,

## 2021-12-29 ENCOUNTER — Emergency Department (HOSPITAL_COMMUNITY)
Admission: EM | Admit: 2021-12-29 | Discharge: 2021-12-29 | Discharge status: Against Medical Advice | Payer: Medicaid Other | Attending: Physician Assistant | Admitting: Physician Assistant

## 2021-12-29 ENCOUNTER — Other Ambulatory Visit: Payer: Self-pay

## 2021-12-29 ENCOUNTER — Emergency Department (HOSPITAL_COMMUNITY): Payer: Medicaid Other

## 2021-12-29 ENCOUNTER — Encounter (HOSPITAL_COMMUNITY): Payer: Self-pay | Admitting: Emergency Medicine

## 2021-12-29 DIAGNOSIS — Z5321 Procedure and treatment not carried out due to patient leaving prior to being seen by health care provider: Secondary | ICD-10-CM | POA: Diagnosis not present

## 2021-12-29 DIAGNOSIS — R110 Nausea: Secondary | ICD-10-CM | POA: Diagnosis not present

## 2021-12-29 DIAGNOSIS — R079 Chest pain, unspecified: Secondary | ICD-10-CM | POA: Diagnosis present

## 2021-12-29 LAB — BASIC METABOLIC PANEL
Anion gap: 8 (ref 5–15)
BUN: 10 mg/dL (ref 6–20)
CO2: 25 mmol/L (ref 22–32)
Calcium: 9.1 mg/dL (ref 8.9–10.3)
Chloride: 105 mmol/L (ref 98–111)
Creatinine, Ser: 0.83 mg/dL (ref 0.44–1.00)
GFR, Estimated: >60 mL/min (ref >60)
Glucose, Bld: 91 mg/dL (ref 70–99)
Potassium: 3.6 mmol/L (ref 3.5–5.1)
Sodium: 138 mmol/L (ref 135–145)

## 2021-12-29 LAB — CBC
HCT: 39.8 % (ref 36.0–46.0)
Hemoglobin: 12.6 g/dL (ref 12.0–15.0)
MCH: 25.9 pg — ABNORMAL LOW (ref 26.0–34.0)
MCHC: 31.7 g/dL (ref 30.0–36.0)
MCV: 81.9 fL (ref 80.0–100.0)
Platelets: 483 10*3/uL — ABNORMAL HIGH (ref 150–400)
RBC: 4.86 MIL/uL (ref 3.87–5.11)
RDW: 13.9 % (ref 11.5–15.5)
WBC: 11.9 10*3/uL — ABNORMAL HIGH (ref 4.0–10.5)
nRBC: 0 % (ref 0.0–0.2)

## 2021-12-29 LAB — TROPONIN I (HIGH SENSITIVITY): Troponin I (High Sensitivity): <2 ng/L (ref <18)

## 2021-12-29 NOTE — ED Notes (Signed)
Pt cannot be found and is not answering when sort called

## 2021-12-29 NOTE — ED Triage Notes (Signed)
Pt with left sided chest pain that began 30 min ago.  Has been nauseated x 3 days and has taken home pregnancy tests that were negative.  Pt states she has just been "drinking chicken broth" for 2 days. No vomiting. No diarrhea, urinary symptoms, fever, or chills

## 2022-03-24 ENCOUNTER — Other Ambulatory Visit: Payer: Self-pay

## 2022-03-24 ENCOUNTER — Encounter (HOSPITAL_BASED_OUTPATIENT_CLINIC_OR_DEPARTMENT_OTHER): Payer: Self-pay | Admitting: Emergency Medicine

## 2022-03-24 ENCOUNTER — Emergency Department (HOSPITAL_BASED_OUTPATIENT_CLINIC_OR_DEPARTMENT_OTHER): Payer: Medicaid Other

## 2022-03-24 ENCOUNTER — Emergency Department (HOSPITAL_BASED_OUTPATIENT_CLINIC_OR_DEPARTMENT_OTHER)
Admission: EM | Admit: 2022-03-24 | Discharge: 2022-03-24 | Disposition: A | Discharge status: Home / Self Care | Payer: Medicaid Other | Attending: Emergency Medicine | Admitting: Emergency Medicine

## 2022-03-24 DIAGNOSIS — R112 Nausea with vomiting, unspecified: Secondary | ICD-10-CM | POA: Diagnosis not present

## 2022-03-24 DIAGNOSIS — R197 Diarrhea, unspecified: Secondary | ICD-10-CM | POA: Insufficient documentation

## 2022-03-24 DIAGNOSIS — R1013 Epigastric pain: Secondary | ICD-10-CM | POA: Diagnosis present

## 2022-03-24 DIAGNOSIS — N9489 Other specified conditions associated with female genital organs and menstrual cycle: Secondary | ICD-10-CM | POA: Diagnosis not present

## 2022-03-24 DIAGNOSIS — K2900 Acute gastritis without bleeding: Secondary | ICD-10-CM

## 2022-03-24 LAB — COMPREHENSIVE METABOLIC PANEL
ALT: 24 U/L (ref 0–44)
AST: 19 U/L (ref 15–41)
Albumin: 3.9 g/dL (ref 3.5–5.0)
Alkaline Phosphatase: 74 U/L (ref 38–126)
Anion gap: 5 (ref 5–15)
BUN: 13 mg/dL (ref 6–20)
CO2: 23 mmol/L (ref 22–32)
Calcium: 8.6 mg/dL — ABNORMAL LOW (ref 8.9–10.3)
Chloride: 106 mmol/L (ref 98–111)
Creatinine, Ser: 0.55 mg/dL (ref 0.44–1.00)
GFR, Estimated: >60 mL/min (ref >60)
Glucose, Bld: 108 mg/dL — ABNORMAL HIGH (ref 70–99)
Potassium: 4.1 mmol/L (ref 3.5–5.1)
Sodium: 134 mmol/L — ABNORMAL LOW (ref 135–145)
Total Bilirubin: 0.6 mg/dL (ref 0.3–1.2)
Total Protein: 7.5 g/dL (ref 6.5–8.1)

## 2022-03-24 LAB — URINALYSIS, ROUTINE W REFLEX MICROSCOPIC
Bilirubin Urine: NEGATIVE
Glucose, UA: NEGATIVE mg/dL
Hgb urine dipstick: NEGATIVE
Ketones, ur: 80 mg/dL — AB
Leukocytes,Ua: NEGATIVE
Nitrite: NEGATIVE
Protein, ur: NEGATIVE mg/dL
Specific Gravity, Urine: 1.015 (ref 1.005–1.030)
pH: 6.5 (ref 5.0–8.0)

## 2022-03-24 LAB — CBC
HCT: 41.8 % (ref 36.0–46.0)
Hemoglobin: 13.1 g/dL (ref 12.0–15.0)
MCH: 24.4 pg — ABNORMAL LOW (ref 26.0–34.0)
MCHC: 31.3 g/dL (ref 30.0–36.0)
MCV: 78 fL — ABNORMAL LOW (ref 80.0–100.0)
Platelets: 456 10*3/uL — ABNORMAL HIGH (ref 150–400)
RBC: 5.36 MIL/uL — ABNORMAL HIGH (ref 3.87–5.11)
RDW: 14.6 % (ref 11.5–15.5)
WBC: 10.7 10*3/uL — ABNORMAL HIGH (ref 4.0–10.5)
nRBC: 0 % (ref 0.0–0.2)

## 2022-03-24 LAB — LIPASE, BLOOD: Lipase: 30 U/L (ref 11–51)

## 2022-03-24 LAB — TROPONIN I (HIGH SENSITIVITY): Troponin I (High Sensitivity): <2 ng/L (ref <18)

## 2022-03-24 LAB — HCG, QUANTITATIVE, PREGNANCY: hCG, Beta Chain, Quant, S: <1 m[IU]/mL (ref <5)

## 2022-03-24 MED ORDER — ONDANSETRON HCL 4 MG PO TABS: 4.0000 mg | ORAL_TABLET | Freq: Four times a day (QID) | ORAL | 0 refills | Status: DC

## 2022-03-24 MED ORDER — ALUM & MAG HYDROXIDE-SIMETH 200-200-20 MG/5ML PO SUSP
30.0000 mL | Freq: Once | ORAL | Status: AC
  Administered 2022-03-24: 30 mL via ORAL
  Filled 2022-03-24: qty 30

## 2022-03-24 MED ORDER — LIDOCAINE VISCOUS HCL 2 % MT SOLN
15.0000 mL | Freq: Once | OROMUCOSAL | Status: AC
  Administered 2022-03-24: 15 mL via ORAL
  Filled 2022-03-24: qty 15

## 2022-03-24 MED ORDER — MAALOX MAX 400-400-40 MG/5ML PO SUSP: 10.0000 mL | Freq: Four times a day (QID) | ORAL | 0 refills | Status: DC | PRN

## 2022-03-24 MED ORDER — SODIUM CHLORIDE 0.9 % IV BOLUS
1000.0000 mL | Freq: Once | INTRAVENOUS | Status: AC
  Administered 2022-03-24: 1000 mL via INTRAVENOUS

## 2022-03-24 MED ORDER — FAMOTIDINE 20 MG PO TABS: 20.0000 mg | ORAL_TABLET | Freq: Two times a day (BID) | ORAL | 0 refills | Status: DC

## 2022-03-24 MED ORDER — FAMOTIDINE IN NACL 20-0.9 MG/50ML-% IV SOLN
20.0000 mg | Freq: Once | INTRAVENOUS | Status: AC
  Administered 2022-03-24: 20 mg via INTRAVENOUS
  Filled 2022-03-24: qty 50

## 2022-03-24 MED ORDER — IOHEXOL 300 MG/ML  SOLN
100.0000 mL | Freq: Once | INTRAMUSCULAR | Status: AC | PRN
  Administered 2022-03-24: 100 mL via INTRAVENOUS

## 2022-03-24 MED ORDER — ONDANSETRON HCL 4 MG/2ML IJ SOLN
4.0000 mg | Freq: Once | INTRAMUSCULAR | Status: AC
  Administered 2022-03-24: 4 mg via INTRAVENOUS
  Filled 2022-03-24: qty 2

## 2022-03-24 MED ORDER — DICYCLOMINE HCL 10 MG PO CAPS
10.0000 mg | ORAL_CAPSULE | Freq: Once | ORAL | Status: AC
  Administered 2022-03-24: 10 mg via ORAL
  Filled 2022-03-24: qty 1

## 2022-03-24 NOTE — ED Triage Notes (Signed)
Upper / epigastric abdominal pain x 2 days , chest tightness and shortness of breath  Hx anxiety  Nausea , Hx pancreatitis .

## 2022-03-24 NOTE — ED Notes (Signed)
Ambulatory to br.

## 2022-03-24 NOTE — ED Provider Notes (Signed)
  Physical Exam  BP (!) 130/98   Pulse 96   Temp 98.8 F (37.1 C) (Oral)   Resp 18   Ht 5' 7.5" (1.715 m)   Wt 81.6 kg   LMP 02/17/2022 (Approximate)   SpO2 98%   BMI 27.78 kg/m   Physical Exam  Procedures  Procedures  ED Course / MDM   Clinical Course as of 03/24/22 1825  Thu Mar 24, 2022  1539 21 yo F with hx of cholecytectomy with epigastric pain and N/V. Given GI cocktail, fluids, zofran. Awaiting CT scan at this time. Plan on prilosec and antiemetics with possible GI fu.  [RP]  1824 Patient reassessed and is doing well.  CT scan did not show acute abnormality.  Still reporting mild abdominal pain so we will give Bentyl and Pepcid.  Instructed the patient that she is likely having gastritis and will need to follow-up with her primary doctor and GI doctor.  Instructed to take Pepcid for pain and avoid alcohol and NSAIDs as they may exacerbate her gastritis.  Return precautions discussed prior to discharge. [RP]    Clinical Course User Index [RP] Fransico Meadow, MD   Medical Decision Making Amount and/or Complexity of Data Reviewed Labs: ordered. Radiology: ordered.  Risk OTC drugs. Prescription drug management.      Fransico Meadow, MD 03/24/22 [phone]

## 2022-03-24 NOTE — ED Notes (Signed)
Could not give a urine sample at this time

## 2022-03-24 NOTE — Discharge Instructions (Signed)
Today you were seen in the emergency department for your stomach pain.    In the emergency department you had labs and a CT scan that were reassuring.    At home, please take Maalox and the Pepcid we have prescribed you for your gastritis.  Please avoid alcohol and NSAIDs such as ibuprofen as they can worsen this condition.  You may also take Zofran as needed for any nausea and vomiting that you have.  Check your MyChart online for the results of any tests that had not resulted by the time you left the emergency department.   Follow-up with your primary doctor in 2-3 days regarding your visit.  Follow-up with GI about your symptoms as well.  Return immediately to the emergency department if you experience any of the following: Worsening pain, vomiting, or any other concerning symptoms.    Thank you for visiting our Emergency Department. It was a pleasure taking care of you today.

## 2022-03-24 NOTE — ED Notes (Signed)
Pt states abd pain and diarrhea x 2 days  not eating well, pt drove herself here

## 2022-03-24 NOTE — ED Provider Notes (Signed)
[facility] EMERGENCY DEPARTMENT Provider Note   CSN: [id number] Arrival date & time: 03/24/22  1155     History  Chief Complaint  Patient presents with   Abdominal Pain    Beth Key is a 21 y.o. female.  The history is provided by the patient and medical records. No language interpreter was used.  Abdominal Pain Pain location:  Epigastric Pain quality: aching, burning, cramping and sharp   Pain radiates to:  Chest (mild buring to chest) Pain severity:  Moderate Onset quality:  Gradual Duration:  2 days Timing:  Constant Progression:  Waxing and waning Chronicity:  New Relieved by:  Nothing Worsened by:  Eating Ineffective treatments:  None tried Associated symptoms: diarrhea, nausea and vomiting   Associated symptoms: no anorexia, no chest pain, no chills, no constipation, no cough, no dysuria, no fatigue, no fever and no shortness of breath        Home Medications Prior to Admission medications   Medication Sig Start Date End Date Taking? Authorizing Provider  aspirin-acetaminophen-caffeine (EXCEDRIN MIGRAINE) [phone] MG tablet Take 2 tablets by mouth daily as needed for headache or migraine. Patient not taking: Reported on 07/01/2021    [provider]  pantoprazole (PROTONIX) 20 MG tablet Take 1 tablet (20 mg total) by mouth daily for 14 days. Patient not taking: No sig reported 01/30/21 02/13/21  Lennice Sites, DO  PROAIR HFA 108 (90 Base) MCG/ACT inhaler Inhale 2 puffs into the lungs every 6 (six) hours as needed for wheezing or shortness of breath. Patient not taking: Reported on 07/01/2021 03/30/20   [provider]  vitamin B-12 (CYANOCOBALAMIN) 250 MCG tablet Take 250 mcg by mouth in the morning. Patient not taking: Reported on 02/12/2021    [provider]  Vitamin D3 (VITAMIN D) 25 MCG tablet Take 1,000 Units by mouth in the morning. Patient not taking: Reported on 02/12/2021    [provider]       Allergies    Penicillins, Promethazine, and Reglan [metoclopramide]    Review of Systems   Review of Systems  Constitutional:  Negative for chills, fatigue and fever.  HENT:  Negative for congestion.   Respiratory:  Negative for cough, chest tightness, shortness of breath and wheezing.   Cardiovascular:  Negative for chest pain.  Gastrointestinal:  Positive for abdominal pain, diarrhea, nausea and vomiting. Negative for anorexia and constipation.  Genitourinary:  Negative for dysuria, flank pain and frequency.  Musculoskeletal:  Negative for back pain, neck pain and neck stiffness.  Skin:  Negative for rash and wound.  Neurological:  Negative for headaches.  Psychiatric/Behavioral:  Negative for agitation, confusion and decreased concentration.   All other systems reviewed and are negative.   Physical Exam Updated Vital Signs Ht 5' 7.5" (1.715 m)   Wt 81.6 kg   LMP 02/17/2022 (Approximate)   BMI 27.78 kg/m  Physical Exam Vitals and nursing note reviewed.  Constitutional:      General: She is not in acute distress.    Appearance: She is well-developed. She is not ill-appearing, toxic-appearing or diaphoretic.  HENT:     Head: Normocephalic and atraumatic.     Mouth/Throat:     Mouth: Mucous membranes are dry.  Eyes:     Conjunctiva/sclera: Conjunctivae normal.  Cardiovascular:     Rate and Rhythm: Normal rate and regular rhythm.     Heart sounds: No murmur heard. Pulmonary:     Effort: Pulmonary effort is normal. No respiratory distress.  Breath sounds: Normal breath sounds.  Abdominal:     General: Abdomen is flat. Bowel sounds are normal. There is no distension.     Palpations: Abdomen is soft.     Tenderness: There is abdominal tenderness in the epigastric area. There is no right CVA tenderness or left CVA tenderness.  Musculoskeletal:        General: No swelling.     Cervical back: Neck supple.  Skin:    General: Skin is warm and dry.     Capillary Refill:  Capillary refill takes less than 2 seconds.     Coloration: Skin is not pale.     Findings: No rash.  Neurological:     Mental Status: She is alert.  Psychiatric:        Mood and Affect: Mood normal.     ED Results / Procedures / Treatments   Labs (all labs ordered are listed, but only abnormal results are displayed) Labs Reviewed  COMPREHENSIVE METABOLIC PANEL - Abnormal; Notable for the following components:      Result Value   Sodium 134 (*)    Glucose, Bld 108 (*)    Calcium 8.6 (*)    All other components within normal limits  CBC - Abnormal; Notable for the following components:   WBC 10.7 (*)    RBC 5.36 (*)    MCV 78.0 (*)    MCH 24.4 (*)    Platelets 456 (*)    All other components within normal limits  LIPASE, BLOOD  URINALYSIS, ROUTINE W REFLEX MICROSCOPIC  PREGNANCY, URINE  TROPONIN I (HIGH SENSITIVITY)  TROPONIN I (HIGH SENSITIVITY)    EKG EKG Interpretation  Date/Time:  Thursday March 24 2022 12:05:36 EDT Ventricular Rate:  103 PR Interval:  130 QRS Duration: 62 QT Interval:  322 QTC Calculation: 421 R Axis:   40 Text Interpretation: Sinus tachycardia Otherwise normal ECG When compared with ECG of 29-Dec-2021 17:42, PREVIOUS ECG IS PRESENT when compared to prior, similar appearnce. No STEMI Confirmed by Antony Blackbird [phone]) on 03/24/2022 2:22:25 PM  Radiology No results found.  Procedures Procedures    Medications Ordered in ED Medications  ondansetron (ZOFRAN) injection 4 mg (has no administration in time range)  sodium chloride 0.9 % bolus 1,000 mL (has no administration in time range)  alum & mag hydroxide-simeth (MAALOX/MYLANTA) 200-200-20 MG/5ML suspension 30 mL (has no administration in time range)    And  lidocaine (XYLOCAINE) 2 % viscous mouth solution 15 mL (has no administration in time range)    ED Course/ Medical Decision Making/ A&P                           Medical Decision Making Amount and/or Complexity of Data  Reviewed Labs: ordered.    Beth Key is a 21 y.o. female with past medical history significant for previous pancreatitis, ADHD, previous cholecystitis status postcholecystectomy, GERD, asthma, and previous Chiari malformation who presents with nausea, vomiting, diarrhea, and abdominal pain.  According patient, for the last 2 days she has had pain in her epigastric/upper abdominal area with significant nausea vomiting, and diarrhea.  Denies any blood in her emesis or stool and denies any melena.  Denies any fevers or chills.  Denies any rashes.  Denies any urinary changes or vaginal complaints.  Denies any headache. Denies any chest pain, cough, or shortness of breath.  Patient reports no suspicious foods but does report that she has a history of reflux  and burning in her chest in the past.  On exam, lungs clear and chest nontender.  Abdomen is slightly tender in the epigastric area but otherwise nontender.  Bowel sounds are appreciated.  Flanks and back nontender.  No focal neurologic deficits.  Good pulses in extremities.  Patient is actively vomiting during my initial evaluation.  Clinically I suspect patient has a viral gastroenteritis given the nausea and vomiting, and epigastric discomfort however we discussed that given her history of reflux she could also have a gastritis or ulceration.  With her history of abdominal surgery and the mild tenderness with her symptoms we did agree to get a CT scan to rule out a diverticulitis, ulcer with perforation, or other acute abnormality.  We will give her some fluids and nausea medicine and a GI cocktail.  She had screening labs in triage that does show mild leukocytosis.  We will wait for pregnancy test and urinalysis and get a CT scan.  If work-up is reassuring, dissipate discharge with Prilosec and nausea medicine and have him follow-up with a GI doctor.    Care transferred to oncoming team to await for work-up results and  reassessment.         Final Clinical Impression(s) / ED Diagnoses Final diagnoses:  Epigastric pain  Nausea vomiting and diarrhea    Clinical Impression: 1. Epigastric pain   2. Nausea vomiting and diarrhea     Disposition: Care transferred to oncoming team to await reassessment after work-up is completed to determine disposition.    This note was prepared with assistance of Systems analyst. Occasional wrong-word or sound-a-like substitutions may have occurred due to the inherent limitations of voice recognition software.     Ellieana Dolecki, Gwenyth Allegra, MD 03/24/22 1500

## 2022-03-24 NOTE — ED Notes (Signed)
Pt states feels better.

## 2022-03-24 NOTE — ED Notes (Signed)
Pt to CT

## 2022-10-09 ENCOUNTER — Encounter (HOSPITAL_BASED_OUTPATIENT_CLINIC_OR_DEPARTMENT_OTHER): Payer: Self-pay | Admitting: Emergency Medicine

## 2022-10-09 ENCOUNTER — Emergency Department (HOSPITAL_BASED_OUTPATIENT_CLINIC_OR_DEPARTMENT_OTHER)
Admission: EM | Admit: 2022-10-09 | Discharge: 2022-10-09 | Discharge status: Against Medical Advice | Payer: Medicaid Other | Attending: Emergency Medicine | Admitting: Emergency Medicine

## 2022-10-09 ENCOUNTER — Other Ambulatory Visit: Payer: Self-pay

## 2022-10-09 DIAGNOSIS — Z5321 Procedure and treatment not carried out due to patient leaving prior to being seen by health care provider: Secondary | ICD-10-CM | POA: Diagnosis not present

## 2022-10-09 DIAGNOSIS — R1112 Projectile vomiting: Secondary | ICD-10-CM | POA: Diagnosis present

## 2022-10-09 LAB — COMPREHENSIVE METABOLIC PANEL
ALT: 19 U/L (ref 0–44)
AST: 20 U/L (ref 15–41)
Albumin: 4.3 g/dL (ref 3.5–5.0)
Alkaline Phosphatase: 71 U/L (ref 38–126)
Anion gap: 9 (ref 5–15)
BUN: 10 mg/dL (ref 6–20)
CO2: 24 mmol/L (ref 22–32)
Calcium: 8.9 mg/dL (ref 8.9–10.3)
Chloride: 104 mmol/L (ref 98–111)
Creatinine, Ser: 0.66 mg/dL (ref 0.44–1.00)
GFR, Estimated: >60 mL/min (ref >60)
Glucose, Bld: 89 mg/dL (ref 70–99)
Potassium: 3.4 mmol/L — ABNORMAL LOW (ref 3.5–5.1)
Sodium: 137 mmol/L (ref 135–145)
Total Bilirubin: 0.6 mg/dL (ref 0.3–1.2)
Total Protein: 7.3 g/dL (ref 6.5–8.1)

## 2022-10-09 LAB — CBC WITH DIFFERENTIAL/PLATELET
Abs Immature Granulocytes: 0.04 10*3/uL (ref 0.00–0.07)
Basophils Absolute: 0.1 10*3/uL (ref 0.0–0.1)
Basophils Relative: 1 %
Eosinophils Absolute: 0 10*3/uL (ref 0.0–0.5)
Eosinophils Relative: 0 %
HCT: 33.2 % — ABNORMAL LOW (ref 36.0–46.0)
Hemoglobin: 10 g/dL — ABNORMAL LOW (ref 12.0–15.0)
Immature Granulocytes: 0 %
Lymphocytes Relative: 13 %
Lymphs Abs: 1.3 10*3/uL (ref 0.7–4.0)
MCH: 22.3 pg — ABNORMAL LOW (ref 26.0–34.0)
MCHC: 30.1 g/dL (ref 30.0–36.0)
MCV: 74.1 fL — ABNORMAL LOW (ref 80.0–100.0)
Monocytes Absolute: 0.5 10*3/uL (ref 0.1–1.0)
Monocytes Relative: 5 %
Neutro Abs: 8.3 10*3/uL — ABNORMAL HIGH (ref 1.7–7.7)
Neutrophils Relative %: 81 %
Platelets: 505 10*3/uL — ABNORMAL HIGH (ref 150–400)
RBC: 4.48 MIL/uL (ref 3.87–5.11)
RDW: 14.6 % (ref 11.5–15.5)
WBC: 10.2 10*3/uL (ref 4.0–10.5)
nRBC: 0 % (ref 0.0–0.2)

## 2022-10-09 LAB — LIPASE, BLOOD: Lipase: 28 U/L (ref 11–51)

## 2022-10-09 MED ORDER — SODIUM CHLORIDE 0.9 % IV BOLUS: 1000.0000 mL | Freq: Once | INTRAVENOUS | Status: DC

## 2022-10-09 MED ORDER — ONDANSETRON HCL 4 MG/2ML IJ SOLN: 4.0000 mg | Freq: Once | INTRAMUSCULAR | Status: DC

## 2022-10-09 NOTE — ED Notes (Signed)
Unable to get a urine sample will ask again when pt is in treatment room. Pt is aware that one is needed.

## 2022-10-09 NOTE — ED Triage Notes (Signed)
Pt w/ decreased appetite for a few days and then "projectile vomited" after eating for the first time today; denies pain

## 2023-02-17 ENCOUNTER — Ambulatory Visit (HOSPITAL_COMMUNITY)
Admission: EM | Admit: 2023-02-17 | Discharge: 2023-02-17 | Disposition: A | Discharge status: Home / Self Care | Payer: Medicaid Other

## 2023-02-17 DIAGNOSIS — F331 Major depressive disorder, recurrent, moderate: Secondary | ICD-10-CM

## 2023-02-17 NOTE — Progress Notes (Signed)
   02/17/23 1258  [facility] Triage Screening (Walk-ins at [facility] only)  How Did You Hear About [place]? Legal System  What Is the Reason for Your Visit/Call Today? Beth Key is a 22 year old female that presents to [facility] voluntarily escorted by [employer]. Pt reports that she self harmed last night because she was "going through alot and she felt it was impulsive". Pt reported to have a large "sharp" blade and just felt the need to slit her arm. Pt reports that the wound is "very deep". Pt denies ever having thoughts of wanting to hurt herself or end her life. Pt does report to have passive thoughts of harming herself a few times a month. Pt reports she does struggle with moderate depression and anxiety currently. Pt reports to also have a hx of cannabis. However, the pt is not currently obtaining any substances at this time. Pt is eager to find a therapist to help her process through her trauma and everyday life stressors. Pt denies HI and AVH as well.  How Long Has This Been Causing You Problems? <Week  Have You Recently Had Any Thoughts About Hurting Yourself? Yes  How long ago did you have thoughts about hurting yourself? last night  Are You Planning to Commit Suicide/Harm Yourself At This time? No  Have you Recently Had Thoughts About Hurting Someone Karolee Ohs? No  Are You Planning To Harm Someone At This Time? No  Are you currently experiencing any auditory, visual or other hallucinations? No  Have You Used Any Alcohol or Drugs in the Past 24 Hours? No  Do you have any current medical co-morbidities that require immediate attention? No  Clinician description of patient physical appearance/behavior: calm, cooperative  What Do You Feel Would Help You the Most Today? Medication(s);Stress Management  If access to [facility] was not available, would you have sought care in the Emergency Department? No  Determination of Need Urgent (48 hours)  Options For Referral Outpatient Therapy

## 2023-02-17 NOTE — ED Notes (Signed)
Pt discharged with  AVS.  AVS reviewed prior to discharge.  Pt alert, oriented, and ambulatory.  Safety maintained.  °
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
## 2023-02-17 NOTE — Discharge Instructions (Addendum)
Based on the information that you have provided and the presenting issues outpatient services and resources for have been recommended.  It is imperative that you follow through with treatment recommendations within 5-7 days from the of discharge to mitigate further risk to your safety and mental well-being. A list of referrals has been provided below to get you started.  You are not limited to the list provided.  In case of an urgent crisis, you may contact the Mobile Crisis Unit with [facility] at [phone].   Outpatient Services for Therapy and Medication Management   Based on what you have shared, a list of resources for outpatient therapy and psychiatry is provided below to get you started back on treatment.  It is imperative that you follow through with treatment within 5-7 days from the day of discharge to prevent any further risk to your safety or mental well-being.  You are not limited to the list provided.  In case of an urgent crisis, you may contact the Mobile Crisis Unit with [facility] at [phone].         [facility] [address], [place], [address] [phone] phone  New Patient Assessment/Therapy Walk-ins Monday and Wednesday: 8am until slots are full. Every 1st and 2nd Friday: 1pm - 5pm  NO ASSESSMENT/THERAPY WALK-INS ON TUESDAYS OR THURSDAYS  New Patient Psychiatry/Medication Management Walk-ins Monday-Friday: 8am-11am  For all walk-ins, we ask that you arrive by 7:30am because patient will be seen in the order of arrival.  Availability is limited; therefore, you may not be seen on the same day that you walk-in.  Our goal is to serve and meet the needs of our community to the best of our ability.   [facility] [address] [place], [place], [address] [phone] phone  [facility] [address] [place], [place], [address] [phone]  phone [phone] phone ([address], [address], Anthem/Elevance, [address], [address], [address], [address], Healthy [place], [place], [place], [address], [employer], [place], UHC, American Financial, [place], Out of Network)  [facility], [place] [address] [place], [place], [address] [phone] phone ([place], Anthem/Elevance, [employer] Options/Carelon, BCBS, [address], [place], [place], [place], [place], [employer], [place], [place], [place], [facility])  [facility] [address] [place], [place], [address] [phone] phone (Medicaid, ask about other insurance)  [facility] [address] [place], [place], [address] [phone] phone [phone] fax ([address], [place] , [place], [place], [place] Health Choice, UHC, [employer] Electric, Self-Pay)  [facility] [address] [place], [place], [address] [phone] phone ([address], Anthem/Elevance, [address], [address], [place], [employer], [place], [place], [place], [employer], [place], [place], [place], [facility])  [facility] - 6-8 MONTH WAIT FOR THERAPY; SOONER FOR MEDICATION MANAGEMENT [address] [place], [place], [address] [phone] phone ([address] AmeriHealth [address] - [place], [address], [place], [place], Friday Health Plans, [address], BCBS Healthy [place], [place], [address], [place], [place], [place], [place], Tricare, UHC, [employer]co Corporation, [place])  [facility] [address] [place], [place], [address] [phone] phone  [facility] [address] [place], [place], [address] [phone] phone  [facility] [address], [address] [place], [place], [address] [phone] phone  [facility] [place] - THERAPY ONLY [address], [place], [address] [phone] phone  [facility], [place] [address], [place], [address] [phone] phone  [facility] [address] [place], [place], [address] [phone] phone [phone] fax  [facility] [address] [place], [place], [address] [phone] phone [phone] fax  [facility] [phone] [place], [place], [address] [phone] phone  Jovita Kussmaul [address] [place], [place], [address]  [phone] phone  [facility]  (Adults Only) [address] [place], [place], [address]  [phone] phone [phone] fax
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
## 2023-03-15 ENCOUNTER — Other Ambulatory Visit: Payer: Self-pay

## 2023-03-15 ENCOUNTER — Encounter (HOSPITAL_BASED_OUTPATIENT_CLINIC_OR_DEPARTMENT_OTHER): Payer: Self-pay | Admitting: Emergency Medicine

## 2023-03-15 ENCOUNTER — Emergency Department (HOSPITAL_BASED_OUTPATIENT_CLINIC_OR_DEPARTMENT_OTHER)
Admission: EM | Admit: 2023-03-15 | Discharge: 2023-03-15 | Disposition: A | Discharge status: Home / Self Care | Payer: Medicaid Other | Attending: Emergency Medicine | Admitting: Emergency Medicine

## 2023-03-15 DIAGNOSIS — G43809 Other migraine, not intractable, without status migrainosus: Secondary | ICD-10-CM | POA: Diagnosis not present

## 2023-03-15 DIAGNOSIS — M62830 Muscle spasm of back: Secondary | ICD-10-CM | POA: Insufficient documentation

## 2023-03-15 DIAGNOSIS — R519 Headache, unspecified: Secondary | ICD-10-CM | POA: Diagnosis present

## 2023-03-15 LAB — PREGNANCY, URINE: Preg Test, Ur: NEGATIVE

## 2023-03-15 MED ORDER — LIDOCAINE 5 % EX PTCH
1.0000 | MEDICATED_PATCH | CUTANEOUS | Status: DC
  Administered 2023-03-15: 1 via TRANSDERMAL
  Filled 2023-03-15: qty 1

## 2023-03-15 MED ORDER — LIDOCAINE 5 % EX PTCH: 1.0000 | MEDICATED_PATCH | CUTANEOUS | 0 refills | Status: DC

## 2023-03-15 MED ORDER — KETOROLAC TROMETHAMINE 60 MG/2ML IM SOLN
15.0000 mg | Freq: Once | INTRAMUSCULAR | Status: AC
  Administered 2023-03-15: 15 mg via INTRAMUSCULAR
  Filled 2023-03-15: qty 2

## 2023-03-15 MED ORDER — CYCLOBENZAPRINE HCL 10 MG PO TABS: 10.0000 mg | ORAL_TABLET | Freq: Two times a day (BID) | ORAL | 0 refills | Status: DC | PRN

## 2023-03-15 NOTE — ED Provider Notes (Signed)
[place] [facility] AT [facility] Provider Note   CSN: [id number] Arrival date & time: 03/15/23  1753     History  Chief Complaint  Patient presents with   Neck Pain   Migraine    Beth Key is a 22 y.o. female status post suboccipital craniectomy cervical laminectomy due to Chiari malformation, history of migraines, marijuana use, GERD presented with right-sided headache with right-sided neck pain and back pain for the past 3 days.  Patient states that this feels like her usual migraines and has been taking ibuprofen and Tylenol that have not been helping however states in the past these do not help and that no meds really help in the past including opioids however she is merrily concerned about the headache and she is more concerned about her back as this is new.  Patient states that the pain goes down her entire back but was unable describe whether it is sharp or shooting or dull constant pain.  Patient states that it is a persistent achy pain and states that hurts to move.  Patient tried Tylenol and ibuprofen to no relief and did take one of her dad's muscle relaxers at home but is unsure what the medication was that she is still having symptoms.  Patient denies abdominal pain, nauseous vomiting, changes sensation/motor skills but does state that it hurts to walk.  Patient denies saddle anesthesia, urinary/bowel incontinence.  Patient states that for the headache she does not have any vision changes.  Patient states that the headache is on the right side of her head and is similar to previous headaches.  Headache is nonpositional.  Patient states she did start a new job however denies any repetitive movements or heavy lifting.  Home Medications Prior to Admission medications   Medication Sig Start Date End Date Taking? Authorizing Provider  cyclobenzaprine (FLEXERIL) 10 MG tablet Take 1 tablet (10 mg total) by mouth 2 (two) times daily as needed for muscle  spasms. 03/15/23  Yes Samon Dishner, Fayrene Fearing T, PA-C  lidocaine (LIDODERM) 5 % Place 1 patch onto the skin daily. Remove & Discard patch within 12 hours or as directed by MD 03/15/23  Yes Netta Corrigan, PA-C  venlafaxine XR (EFFEXOR-XR) 37.5 MG 24 hr capsule Take 37.5 mg by mouth daily.    [provider]      Allergies    Penicillins, Promethazine, and Reglan [metoclopramide]    Review of Systems   Review of Systems  Musculoskeletal:  Positive for neck pain.    Physical Exam Updated Vital Signs BP (!) 140/90 (BP Location: Left Arm)   Pulse 95   Temp 98.6 F (37 C)   Resp 17   Wt 90.7 kg   SpO2 100%   BMI 31.32 kg/m  Physical Exam Constitutional:      Appearance: She is normal weight.  HENT:     Head: Normocephalic.     Comments: No temporal artery tenderness No jaw claudication Eyes:     Extraocular Movements: Extraocular movements intact.     Conjunctiva/sclera: Conjunctivae normal.     Pupils: Pupils are equal, round, and reactive to light.  Cardiovascular:     Rate and Rhythm: Normal rate and regular rhythm.     Pulses: Normal pulses.     Heart sounds: Normal heart sounds.  Pulmonary:     Effort: Pulmonary effort is normal.     Breath sounds: Normal breath sounds.  Abdominal:     Palpations: Abdomen is soft.  Tenderness: There is no abdominal tenderness. There is no guarding or rebound.  Musculoskeletal:        General: Normal range of motion.     Cervical back: Normal range of motion. No rigidity or tenderness.     Comments: Tenderness to entire back however no bony or muscular abnormalities palpated  Skin:    General: Skin is warm and dry.     Capillary Refill: Capillary refill takes less than 2 seconds.  Neurological:     General: No focal deficit present.     Mental Status: She is alert.     Sensory: Sensation is intact.     Motor: Motor function is intact.     Coordination: Coordination is intact.     Gait: Gait is intact.     Comments:  Cranial nerves III through XII intact Vision grossly intact Sensation tact in all 4 extremities 2+ bilateral patellar reflexes  Psychiatric:        Mood and Affect: Mood normal.     ED Results / Procedures / Treatments   Labs (all labs ordered are listed, but only abnormal results are displayed) Labs Reviewed  PREGNANCY, URINE    EKG None  Radiology No results found.  Procedures Procedures    Medications Ordered in ED Medications  lidocaine (LIDODERM) 5 % 1 patch (1 patch Transdermal Patch Applied 03/15/23 1844)  ketorolac (TORADOL) injection 15 mg (15 mg Intramuscular Given 03/15/23 1930)    ED Course/ Medical Decision Making/ A&P                                 Medical Decision Making Amount and/or Complexity of Data Reviewed Labs: ordered.   [name] 22 y.o. presented today for headache, back pain. Working DDx that I considered at this time includes, but not limited to, tension headache, migraine, intracranial mass, intracranial hemorrhage, intracranial infection including meningitis vs encephalitis, GCA, trigeminal neuralgia, preeclampsia/eclampsia, AVM, sinusitis, cerebral aneurysm, muscular headache, cavernous sinus thrombosis, carotid artery dissection, muscle spasm, cauda equina, spinal cord injury MSK strain, fracture.  R/o DDx: intracranial mass, intracranial hemorrhage, intracranial infection including meningitis vs encephalitis, GCA, trigeminal neuralgia, preeclampsia/eclampsia, AVM, sinusitis, cerebral aneurysm, muscular headache, cavernous sinus thrombosis, carotid artery dissection:  less likely due to history of present illness, physical exam, labs/imaging findings  Review of prior external notes: 02/17/2039  Unique Tests and My Interpretation:  UPT: Negative  Social Determinants of Health: EtOH/Substance Abuse  Discussion with Independent Historian: None  Discussion of Management of Tests: None  Risk: Medium: prescription drug  management  Risk Stratification Score: None  Staffed with Long, MD  Plan: On exam patient was in no acute distress with stable vitals.  Patient's exam was reassuring along with neurologic exam however patient did have tenderness throughout the entirety of her back and stated that it was just a dull sensation in which she feels a constant ache.  Suspect patient may have a muscle spasm as her exam is reassuring and she is not endorsing left leg symptoms and she is tender over.  Patient did not have any weakness that would be concerning and had good reflexes and no midline abnormalities.  Will give lidocaine patches and prescribed Flexeril.  Spoke to patient about not operate machinery or drive after using Flexeril as it is very sedating to her legs at night.  Encourage patient to continue on Tylenol and ibuprofen every 6 hours as needed for pain.  Returns patient's headache patient I agree that we do not need to do a CT scan today as this is her chronic headache and she was endorsing any new features of the headache or any red flag symptoms.  Patient's neuroexam was also reassuring as well and patient does not show any signs of meningismus.  I spoke to the patient and we agreed to have neurology follow-up as patient does have history of brain surgery with migraines and has multiple medication allergies and so this way she can be placed on prophylactic medication for her migraines.  Will also give work note so patient may rest over the next few days.  Patient given 1 shot of Toradol before discharge.  Patient was given return precautions. Patient stable for discharge at this time.  Patient verbalized understanding of plan.  This chart was dictated using voice recognition software.  Despite best efforts to proofread,  errors can occur which can change the documentation meaning.         Final Clinical Impression(s) / ED Diagnoses Final diagnoses:  Other migraine without status migrainosus, not  intractable  Back muscle spasm    Rx / DC Orders ED Discharge Orders          Ordered    lidocaine (LIDODERM) 5 %  Every 24 hours        03/15/23 1924    cyclobenzaprine (FLEXERIL) 10 MG tablet  2 times daily PRN        03/15/23 1924    Ambulatory referral to Neurology       Comments: An appointment is requested in approximately: 1 week   03/15/23 1926              Remi Deter 03/15/23 1937    Long, Arlyss Repress, MD 03/21/23 1052

## 2023-03-15 NOTE — ED Triage Notes (Signed)
Neck pain x 2 days , no injury . Migraine x 2 days . Hx of it . No meds . Right eye light sensitivity .

## 2023-03-15 NOTE — Discharge Instructions (Signed)
Please follow-up with the urologist I have attached your regards recent ER visit due to your migraines as you most likely needed placed on prophylactic medication.  For your back pain most likely have a back spasm and I have prescribed you Flexeril and Lidoderm patches.  The Flexeril is a sedating medicine as it is a muscle relaxer so please do not operate machinery or drive after this.  I have also tested primary care provider for you to follow-up with as well.  May take Tylenol ibuprofen every 6 hours needed pain associated worse please return to ER.

## 2023-03-30 ENCOUNTER — Encounter: Payer: Self-pay | Admitting: Neurology

## 2023-04-26 ENCOUNTER — Emergency Department (HOSPITAL_BASED_OUTPATIENT_CLINIC_OR_DEPARTMENT_OTHER)
Admission: EM | Admit: 2023-04-26 | Discharge: 2023-04-26 | Disposition: A | Discharge status: Home / Self Care | Payer: Medicaid Other | Attending: Emergency Medicine | Admitting: Emergency Medicine

## 2023-04-26 ENCOUNTER — Emergency Department (HOSPITAL_BASED_OUTPATIENT_CLINIC_OR_DEPARTMENT_OTHER): Payer: Medicaid Other | Admitting: Radiology

## 2023-04-26 ENCOUNTER — Encounter (HOSPITAL_BASED_OUTPATIENT_CLINIC_OR_DEPARTMENT_OTHER): Payer: Self-pay

## 2023-04-26 ENCOUNTER — Other Ambulatory Visit: Payer: Self-pay

## 2023-04-26 DIAGNOSIS — O99411 Diseases of the circulatory system complicating pregnancy, first trimester: Secondary | ICD-10-CM | POA: Diagnosis present

## 2023-04-26 DIAGNOSIS — J45909 Unspecified asthma, uncomplicated: Secondary | ICD-10-CM | POA: Insufficient documentation

## 2023-04-26 DIAGNOSIS — R0789 Other chest pain: Secondary | ICD-10-CM | POA: Diagnosis not present

## 2023-04-26 DIAGNOSIS — Z3A01 Less than 8 weeks gestation of pregnancy: Secondary | ICD-10-CM

## 2023-04-26 DIAGNOSIS — R079 Chest pain, unspecified: Secondary | ICD-10-CM

## 2023-04-26 LAB — CBC
HCT: 33.1 % — ABNORMAL LOW (ref 36.0–46.0)
Hemoglobin: 9.7 g/dL — ABNORMAL LOW (ref 12.0–15.0)
MCH: 20.8 pg — ABNORMAL LOW (ref 26.0–34.0)
MCHC: 29.3 g/dL — ABNORMAL LOW (ref 30.0–36.0)
MCV: 71 fL — ABNORMAL LOW (ref 80.0–100.0)
Platelets: 622 10*3/uL — ABNORMAL HIGH (ref 150–400)
RBC: 4.66 MIL/uL (ref 3.87–5.11)
RDW: 18.3 % — ABNORMAL HIGH (ref 11.5–15.5)
WBC: 12.6 10*3/uL — ABNORMAL HIGH (ref 4.0–10.5)
nRBC: 0 % (ref 0.0–0.2)

## 2023-04-26 LAB — URINALYSIS, ROUTINE W REFLEX MICROSCOPIC
Bilirubin Urine: NEGATIVE
Glucose, UA: NEGATIVE mg/dL
Ketones, ur: NEGATIVE mg/dL
Nitrite: NEGATIVE
Protein, ur: NEGATIVE mg/dL
Specific Gravity, Urine: 1.023 (ref 1.005–1.030)
Trans Epithel, UA: 1
pH: 7 (ref 5.0–8.0)

## 2023-04-26 LAB — HCG, QUANTITATIVE, PREGNANCY: hCG, Beta Chain, Quant, S: 1570 m[IU]/mL — ABNORMAL HIGH (ref <5)

## 2023-04-26 LAB — BASIC METABOLIC PANEL
Anion gap: 9 (ref 5–15)
BUN: 14 mg/dL (ref 6–20)
CO2: 25 mmol/L (ref 22–32)
Calcium: 9.2 mg/dL (ref 8.9–10.3)
Chloride: 103 mmol/L (ref 98–111)
Creatinine, Ser: 0.75 mg/dL (ref 0.44–1.00)
GFR, Estimated: >60 mL/min (ref >60)
Glucose, Bld: 93 mg/dL (ref 70–99)
Potassium: 3.6 mmol/L (ref 3.5–5.1)
Sodium: 137 mmol/L (ref 135–145)

## 2023-04-26 LAB — TROPONIN I (HIGH SENSITIVITY)
Troponin I (High Sensitivity): <2 ng/L (ref <18)
Troponin I (High Sensitivity): <2 ng/L (ref <18)

## 2023-04-26 LAB — D-DIMER, QUANTITATIVE: D-Dimer, Quant: <0.27 ug{FEU}/mL (ref 0.00–0.50)

## 2023-04-26 LAB — HCG, SERUM, QUALITATIVE: Preg, Serum: POSITIVE — AB

## 2023-04-26 MED ORDER — FOSFOMYCIN TROMETHAMINE 3 G PO PACK
3.0000 g | PACK | Freq: Once | ORAL | Status: DC
  Filled 2023-04-26: qty 3

## 2023-04-26 MED ORDER — LIDOCAINE VISCOUS HCL 2 % MT SOLN
15.0000 mL | Freq: Once | OROMUCOSAL | Status: AC
  Administered 2023-04-26: 15 mL via ORAL
  Filled 2023-04-26: qty 15

## 2023-04-26 MED ORDER — FOSFOMYCIN TROMETHAMINE 3 G PO PACK: 3.0000 g | PACK | ORAL | 0 refills | Status: DC

## 2023-04-26 MED ORDER — ALUM & MAG HYDROXIDE-SIMETH 200-200-20 MG/5ML PO SUSP
30.0000 mL | Freq: Once | ORAL | Status: AC
  Administered 2023-04-26: 30 mL via ORAL
  Filled 2023-04-26: qty 30

## 2023-04-26 MED ORDER — FOSFOMYCIN TROMETHAMINE 3 G PO PACK: 3.0000 g | PACK | ORAL | 0 refills | Status: AC

## 2023-04-26 MED ORDER — LIDOCAINE 5 % EX PTCH
1.0000 | MEDICATED_PATCH | CUTANEOUS | Status: DC
  Administered 2023-04-26: 1 via TRANSDERMAL
  Filled 2023-04-26: qty 1

## 2023-04-26 MED ORDER — KETOROLAC TROMETHAMINE 15 MG/ML IJ SOLN
15.0000 mg | Freq: Once | INTRAMUSCULAR | Status: AC
  Administered 2023-04-26: 15 mg via INTRAVENOUS
  Filled 2023-04-26: qty 1

## 2023-04-26 MED ORDER — ACETAMINOPHEN 325 MG PO TABS
650.0000 mg | ORAL_TABLET | Freq: Once | ORAL | Status: AC
  Administered 2023-04-26: 650 mg via ORAL
  Filled 2023-04-26: qty 2

## 2023-04-26 NOTE — ED Triage Notes (Signed)
Sudden onset left chest pain and arm pain. SOB. Endorses panic attacks. Was sitting at onset. Cannot correlate to any stressors.  Alprazolam daily.

## 2023-04-26 NOTE — ED Notes (Signed)
Spoke with lab to add on hcg quant

## 2023-04-26 NOTE — Discharge Instructions (Addendum)
As discussed, your imaging and labs are reassuring.  There are no signs of acute process causing chest pain.  Incidentally, your labs did show that you were about 3 to [redacted] weeks pregnant.  I have attached information for OB/GYN.  Please follow-up with them for further evaluation. [facility].  As for the chest pain, you can take Tylenol every 6-8 hours as needed and use a lidocaine patch daily at the area of pain.  Do not take medications like ibuprofen or Advil as they are not indicated during pregnancy. You can take Maalox as well if it helps.  Your urine shows signs of bacteria so we will treat you for UTI. You were given the first dose of Fosfomycin here. Take the second dose on 12/6 and the third on 12/8. Follow up with your PCP after to ensure resolution.  Your gonorrhea and chlamydia labs are send out and take several days to return.  Receive a call if it is positive and you will need to come back to get treated at that time.  Get help right away if: You feel sick to your stomach (nauseous) or you throw up (vomit). You feel sweaty or light-headed. You have a cough with mucus from your lungs (sputum) or you cough up blood. You are short of breath.

## 2023-04-26 NOTE — ED Provider Notes (Incomplete)
[place] EMERGENCY DEPARTMENT AT [facility] Provider Note   CSN: [id number] Arrival date & time: 04/26/23  1336     History {Add pertinent medical, surgical, social history, OB history to HPI:1} Chief Complaint  Patient presents with   Chest Pain    Beth Key is a 22 y.o. female with a history of asthma, GERD, and anxiety presents the ED today for chest pain.  Patient reports she was sitting down at work about an hour prior to arrival when she started having squeezing pain at the left side of her chest.  Patient radiates up to her left shoulder and down her left arm associated with numbness and pain.  She denies radiation to the back.  Pain is worse with deep breaths and movement.  Has not taken anything for pain prior to arrival.  She denies any recent injury or trauma to the chest.  No recent travel or procedures.  Patient states that she has had nonspecific chest pain in the past but this feels different.  No additional complaints or concerns at this time.    Home Medications Prior to Admission medications   Medication Sig Start Date End Date Taking? Authorizing Provider  cyclobenzaprine (FLEXERIL) 10 MG tablet Take 1 tablet (10 mg total) by mouth 2 (two) times daily as needed for muscle spasms. 03/15/23   Netta Corrigan, PA-C  lidocaine (LIDODERM) 5 % Place 1 patch onto the skin daily. Remove & Discard patch within 12 hours or as directed by MD 03/15/23   Netta Corrigan, PA-C  venlafaxine XR (EFFEXOR-XR) 37.5 MG 24 hr capsule Take 37.5 mg by mouth daily.    [provider]      Allergies    Penicillins, Promethazine, and Reglan [metoclopramide]    Review of Systems   Review of Systems  Cardiovascular:  Positive for chest pain.  All other systems reviewed and are negative.   Physical Exam Updated Vital Signs BP (!) 121/90 (BP Location: Right Arm)   Pulse 98   Temp 98.1 F (36.7 C)   Resp 17   SpO2 100%  Physical Exam Vitals and nursing  note reviewed.  Constitutional:      Comments: Patient is anxious appearing on exam  HENT:     Head: Normocephalic and atraumatic.     Mouth/Throat:     Mouth: Mucous membranes are moist.  Eyes:     Conjunctiva/sclera: Conjunctivae normal.     Pupils: Pupils are equal, round, and reactive to light.  Cardiovascular:     Rate and Rhythm: Normal rate and regular rhythm.     Pulses: Normal pulses.     Heart sounds: Normal heart sounds.  Pulmonary:     Effort: Pulmonary effort is normal.     Breath sounds: Normal breath sounds.  Abdominal:     Palpations: Abdomen is soft.     Tenderness: There is no abdominal tenderness.  Musculoskeletal:        General: Tenderness present. Normal range of motion.     Comments: Tenderness to palpation of the left side of the chest  Skin:    General: Skin is warm and dry.     Findings: No rash.  Neurological:     General: No focal deficit present.     Mental Status: She is alert.     Sensory: No sensory deficit.     Motor: No weakness.  Psychiatric:        Mood and Affect: Mood normal.  Behavior: Behavior normal.    ED Results / Procedures / Treatments   Labs (all labs ordered are listed, but only abnormal results are displayed) Labs Reviewed  BASIC METABOLIC PANEL  CBC  PREGNANCY, URINE  TROPONIN I (HIGH SENSITIVITY)    EKG None  Radiology No results found.  Procedures Procedures: not indicated. {Document cardiac monitor, telemetry assessment procedure when appropriate:1}  Medications Ordered in ED Medications  ketorolac (TORADOL) 15 MG/ML injection 15 mg (has no administration in time range)  lidocaine (LIDODERM) 5 % 1 patch (has no administration in time range)    ED Course/ Medical Decision Making/ A&P Clinical Course as of 04/26/23 1816  Wed Apr 26, 2023  1749 Pregnant chest pain.  [CC]    Clinical Course User Index [CC] Glyn Ade, MD   {   Click here for ABCD2, HEART and other calculatorsREFRESH  Note before signing :1}                              Medical Decision Making Amount and/or Complexity of Data Reviewed Labs: ordered. Radiology: ordered.  Risk OTC drugs. Prescription drug management.   This patient presents to the ED for concern of chest pain, this involves an extensive number of treatment options, and is a complaint that carries with it a high risk of complications and morbidity.   Differential diagnosis includes: ACS, pneumonia, pneumothorax, pleural effusion, costochondritis, pleurisy, GERD, gastritis, anxiety, etc.   Comorbidities  See HPI above   Additional History  Additional history obtained from prior records.   Cardiac Monitoring / EKG  The patient was maintained on a cardiac monitor.  I personally viewed and interpreted the cardiac monitored which showed: NSR with a heart rate of 99 bpm.   Lab Tests  I ordered and personally interpreted labs.  The pertinent results include:   Initial troponin <2, repeat <2 as well Negative D-dimer BMP and CBC within normal limits for patient Positive pregnancy test, hCG of 1,570   Imaging Studies  I ordered imaging studies including CXR  I independently visualized and interpreted imaging which showed: No acute cardiopulmonary abnormality. I agree with the radiologist interpretation   Problem List / ED Course / Critical Interventions / Medication Management  Chest pain x1 hour I ordered medications including: Toradol and Lidocaine patch for chest pain  Reevaluation of the patient after these medicines showed that the patient improved.  Patient states that she feels good when she does not move when she lays on her left side she has an increased left chest pain.  Left arm numbness has resolved. I have reviewed the patients home medicines and have made adjustments as needed   Social Determinants of Health  Tobacco use   Test / Admission - Considered  ***   {Document critical care time when  appropriate:1} {Document review of labs and clinical decision tools ie heart score, Chads2Vasc2 etc:1}  {Document your independent review of radiology images, and any outside records:1} {Document your discussion with family members, caretakers, and with consultants:1} {Document social determinants of health affecting pt's care:1} {Document your decision making why or why not admission, treatments were needed:1} Final Clinical Impression(s) / ED Diagnoses Final diagnoses:  None    Rx / DC Orders ED Discharge Orders     None

## 2023-04-26 NOTE — ED Notes (Signed)
Pt now sitting in bed, placed back on monitor and vitals re-checked. Pt still crying, feels anxious. PA aware.

## 2023-04-26 NOTE — ED Notes (Signed)
Pt HR found to be in 130's, pt c/o increased SHOB and chest pain, 'feels like someone is reaching is and grabbing me' on L side of chest. PA notified and additional orders for medication were received. Pt crying and anxious, feels more comfortable standing/pacing.

## 2023-04-27 ENCOUNTER — Telehealth (HOSPITAL_BASED_OUTPATIENT_CLINIC_OR_DEPARTMENT_OTHER): Payer: Self-pay | Admitting: Emergency Medicine

## 2023-04-28 LAB — GC/CHLAMYDIA PROBE AMP (~~LOC~~) NOT AT ARMC
Chlamydia: NEGATIVE
Comment: NEGATIVE
Comment: NORMAL
Neisseria Gonorrhea: NEGATIVE

## 2023-04-28 LAB — URINE CULTURE

## 2023-05-11 ENCOUNTER — Emergency Department (HOSPITAL_BASED_OUTPATIENT_CLINIC_OR_DEPARTMENT_OTHER): Payer: Medicaid Other

## 2023-05-11 ENCOUNTER — Emergency Department (HOSPITAL_BASED_OUTPATIENT_CLINIC_OR_DEPARTMENT_OTHER)
Admission: EM | Admit: 2023-05-11 | Discharge: 2023-05-11 | Disposition: A | Discharge status: Home / Self Care | Payer: Medicaid Other | Attending: Emergency Medicine | Admitting: Emergency Medicine

## 2023-05-11 ENCOUNTER — Other Ambulatory Visit: Payer: Self-pay

## 2023-05-11 ENCOUNTER — Emergency Department (HOSPITAL_BASED_OUTPATIENT_CLINIC_OR_DEPARTMENT_OTHER): Payer: Medicaid Other | Admitting: Radiology

## 2023-05-11 ENCOUNTER — Encounter (HOSPITAL_BASED_OUTPATIENT_CLINIC_OR_DEPARTMENT_OTHER): Payer: Self-pay

## 2023-05-11 DIAGNOSIS — R519 Headache, unspecified: Secondary | ICD-10-CM | POA: Diagnosis not present

## 2023-05-11 DIAGNOSIS — O99281 Endocrine, nutritional and metabolic diseases complicating pregnancy, first trimester: Secondary | ICD-10-CM | POA: Diagnosis present

## 2023-05-11 DIAGNOSIS — Z3A01 Less than 8 weeks gestation of pregnancy: Secondary | ICD-10-CM | POA: Diagnosis not present

## 2023-05-11 DIAGNOSIS — E860 Dehydration: Secondary | ICD-10-CM

## 2023-05-11 DIAGNOSIS — R000 Tachycardia, unspecified: Secondary | ICD-10-CM | POA: Diagnosis not present

## 2023-05-11 DIAGNOSIS — O219 Vomiting of pregnancy, unspecified: Secondary | ICD-10-CM | POA: Insufficient documentation

## 2023-05-11 DIAGNOSIS — R112 Nausea with vomiting, unspecified: Secondary | ICD-10-CM

## 2023-05-11 LAB — URINALYSIS, ROUTINE W REFLEX MICROSCOPIC
Bilirubin Urine: NEGATIVE
Glucose, UA: NEGATIVE mg/dL
Hgb urine dipstick: NEGATIVE
Ketones, ur: >80 mg/dL — AB
Nitrite: NEGATIVE
Protein, ur: 30 mg/dL — AB
Specific Gravity, Urine: 1.03 (ref 1.005–1.030)
pH: 6 (ref 5.0–8.0)

## 2023-05-11 LAB — COMPREHENSIVE METABOLIC PANEL
ALT: 14 U/L (ref 0–44)
AST: 13 U/L — ABNORMAL LOW (ref 15–41)
Albumin: 4.5 g/dL (ref 3.5–5.0)
Alkaline Phosphatase: 61 U/L (ref 38–126)
Anion gap: 8 (ref 5–15)
BUN: 11 mg/dL (ref 6–20)
CO2: 26 mmol/L (ref 22–32)
Calcium: 9.5 mg/dL (ref 8.9–10.3)
Chloride: 102 mmol/L (ref 98–111)
Creatinine, Ser: 0.66 mg/dL (ref 0.44–1.00)
GFR, Estimated: >60 mL/min (ref >60)
Glucose, Bld: 97 mg/dL (ref 70–99)
Potassium: 3.6 mmol/L (ref 3.5–5.1)
Sodium: 136 mmol/L (ref 135–145)
Total Bilirubin: 0.6 mg/dL (ref <1.2)
Total Protein: 7.3 g/dL (ref 6.5–8.1)

## 2023-05-11 LAB — CBC WITH DIFFERENTIAL/PLATELET
Abs Immature Granulocytes: 0.06 10*3/uL (ref 0.00–0.07)
Basophils Absolute: 0.1 10*3/uL (ref 0.0–0.1)
Basophils Relative: 1 %
Eosinophils Absolute: 0.1 10*3/uL (ref 0.0–0.5)
Eosinophils Relative: 1 %
HCT: 37.5 % (ref 36.0–46.0)
Hemoglobin: 10.8 g/dL — ABNORMAL LOW (ref 12.0–15.0)
Immature Granulocytes: 1 %
Lymphocytes Relative: 18 %
Lymphs Abs: 1.6 10*3/uL (ref 0.7–4.0)
MCH: 21.2 pg — ABNORMAL LOW (ref 26.0–34.0)
MCHC: 28.8 g/dL — ABNORMAL LOW (ref 30.0–36.0)
MCV: 73.5 fL — ABNORMAL LOW (ref 80.0–100.0)
Monocytes Absolute: 0.6 10*3/uL (ref 0.1–1.0)
Monocytes Relative: 6 %
Neutro Abs: 6.4 10*3/uL (ref 1.7–7.7)
Neutrophils Relative %: 73 %
Platelets: 519 10*3/uL — ABNORMAL HIGH (ref 150–400)
RBC: 5.1 MIL/uL (ref 3.87–5.11)
RDW: 19.4 % — ABNORMAL HIGH (ref 11.5–15.5)
WBC: 8.8 10*3/uL (ref 4.0–10.5)
nRBC: 0 % (ref 0.0–0.2)

## 2023-05-11 LAB — PREGNANCY, URINE: Preg Test, Ur: POSITIVE — AB

## 2023-05-11 MED ORDER — ONDANSETRON HCL 4 MG/2ML IJ SOLN
4.0000 mg | Freq: Once | INTRAMUSCULAR | Status: AC
  Administered 2023-05-11: 4 mg via INTRAVENOUS
  Filled 2023-05-11: qty 2

## 2023-05-11 MED ORDER — ALBUTEROL SULFATE HFA 108 (90 BASE) MCG/ACT IN AERS: 2.0000 | INHALATION_SPRAY | RESPIRATORY_TRACT | Status: DC | PRN

## 2023-05-11 MED ORDER — ONDANSETRON 4 MG PO TBDP: 4.0000 mg | ORAL_TABLET | Freq: Three times a day (TID) | ORAL | 0 refills | Status: DC | PRN

## 2023-05-11 MED ORDER — SODIUM CHLORIDE 0.9 % IV BOLUS
1000.0000 mL | Freq: Once | INTRAVENOUS | Status: AC
  Administered 2023-05-11: 1000 mL via INTRAVENOUS

## 2023-05-11 NOTE — ED Notes (Signed)
Pt states she has not had the abortion pill yet

## 2023-05-11 NOTE — ED Provider Notes (Signed)
[place] EMERGENCY DEPARTMENT AT [facility] Provider Note   CSN: [id number] Arrival date & time: 05/11/23  2952     History  No chief complaint on file.   Beth Key is a 22 y.o. female.  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  She has been feeling dizzy and nauseous for several days.  She is pregnant, but has been told that she should not have any children due to her [facility] malformation.  Pt has plans for a medical abortion.  She went yesterday, but you have to wait 72 hours to get the medicine due to the law.  She has not been able to keep anything down.  She is dizzy when she stands up.  She has a headache.  She was told she may need a VP shunt in the future.         Home Medications Prior to Admission medications   Medication Sig Start Date End Date Taking? Authorizing Provider  ondansetron (ZOFRAN-ODT) 4 MG disintegrating tablet Take 1 tablet (4 mg total) by mouth every 8 (eight) hours as needed for nausea or vomiting. 05/11/23  Yes Jacalyn Lefevre, MD  cyclobenzaprine (FLEXERIL) 10 MG tablet Take 1 tablet (10 mg total) by mouth 2 (two) times daily as needed for muscle spasms. 03/15/23   Netta Corrigan, PA-C  lidocaine (LIDODERM) 5 % Place 1 patch onto the skin daily. Remove & Discard patch within 12 hours or as directed by MD 03/15/23   Netta Corrigan, PA-C  venlafaxine XR (EFFEXOR-XR) 37.5 MG 24 hr capsule Take 37.5 mg by mouth daily.    [provider]      Allergies    Penicillins, Promethazine, and Reglan [metoclopramide]    Review of Systems   Review of Systems  Gastrointestinal:  Positive for nausea and vomiting.  Neurological:  Positive for headaches.  All other systems reviewed and are negative.   Physical Exam Updated Vital Signs BP 109/60   Pulse 90   Temp 98 F (36.7 C)   Resp 19   LMP 03/08/2023 (Approximate)   SpO2 99%  Physical Exam Vitals  and nursing note reviewed.  Constitutional:      Appearance: Normal appearance.  HENT:     Head: Normocephalic and atraumatic.     Right Ear: External ear normal.     Left Ear: External ear normal.     Nose: Nose normal.     Mouth/Throat:     Mouth: Mucous membranes are dry.  Eyes:     Extraocular Movements: Extraocular movements intact.     Conjunctiva/sclera: Conjunctivae normal.     Pupils: Pupils are equal, round, and reactive to light.  Cardiovascular:     Rate and Rhythm: Regular rhythm. Tachycardia present.     Pulses: Normal pulses.     Heart sounds: Normal heart sounds.  Pulmonary:     Effort: Pulmonary effort is normal.     Breath sounds: Normal breath sounds.  Abdominal:     General: Abdomen is flat. Bowel sounds are normal.     Palpations: Abdomen is soft.  Musculoskeletal:        General: Normal range of motion.     Cervical back: Normal range of motion and neck supple.  Skin:    General: Skin is warm.     Capillary Refill: Capillary refill takes less than 2 seconds.  Neurological:     General: No focal  deficit present.     Mental Status: She is alert and oriented to person, place, and time.  Psychiatric:        Mood and Affect: Mood normal.        Behavior: Behavior normal.     ED Results / Procedures / Treatments   Labs (all labs ordered are listed, but only abnormal results are displayed) Labs Reviewed  URINALYSIS, ROUTINE W REFLEX MICROSCOPIC - Abnormal; Notable for the following components:      Result Value   APPearance HAZY (*)    Ketones, ur >80 (*)    Protein, ur 30 (*)    Leukocytes,Ua SMALL (*)    Bacteria, UA RARE (*)    All other components within normal limits  PREGNANCY, URINE - Abnormal; Notable for the following components:   Preg Test, Ur POSITIVE (*)    All other components within normal limits  COMPREHENSIVE METABOLIC PANEL - Abnormal; Notable for the following components:   AST 13 (*)    All other components within normal  limits  CBC WITH DIFFERENTIAL/PLATELET - Abnormal; Notable for the following components:   Hemoglobin 10.8 (*)    MCV 73.5 (*)    MCH 21.2 (*)    MCHC 28.8 (*)    RDW 19.4 (*)    Platelets 519 (*)    All other components within normal limits    EKG EKG Interpretation Date/Time:  Thursday May 11 2023 10:05:27 EST Ventricular Rate:  109 PR Interval:  134 QRS Duration:  72 QT Interval:  310 QTC Calculation: 417 R Axis:   62  Text Interpretation: Sinus tachycardia Otherwise normal ECG When compared with ECG of 26-Apr-2023 13:42, No significant change was found Since last tracing rate faster Confirmed by Jacalyn Lefevre [phone]) on 05/11/2023 10:25:30 AM  Radiology CT Head Wo Contrast Result Date: 05/11/2023 CLINICAL DATA:  Headache, history of Chiari malformation EXAM: CT HEAD WITHOUT CONTRAST TECHNIQUE: Contiguous axial images were obtained from the base of the skull through the vertex without intravenous contrast. RADIATION DOSE REDUCTION: This exam was performed according to the departmental dose-optimization program which includes automated exposure control, adjustment of the mA and/or kV according to patient size and/or use of iterative reconstruction technique. COMPARISON:  05/21/2021 FINDINGS: Brain: Status post prior suboccipital craniectomy with redemonstrated low lying cerebellar tonsils without increased crowding. Unchanged size and configuration of the ventricles. No evidence of acute infarction, hemorrhage, mass, mass effect, or midline shift. No hydrocephalus or extra-axial fluid collection. Partial empty sella. Vascular: No hyperdense vessel. Skull: Negative for fracture or focal lesion. Prior suboccipital craniectomy. Sinuses/Orbits: No acute finding. Other: The mastoid air cells are well aerated. IMPRESSION: 1. No acute intracranial process. 2. Status post prior suboccipital craniectomy with redemonstrated low lying cerebellar tonsils without increased crowding.  Electronically Signed   By: Wiliam Ke M.D.   On: 05/11/2023 14:41   DG Chest 2 View Result Date: 05/11/2023 CLINICAL DATA:  Shortness of breath.  Dizziness.  Nausea. EXAM: CHEST - 2 VIEW COMPARISON:  Chest radiographs 04/26/2023 FINDINGS: Cardiac silhouette and mediastinal contours are within normal limits. The lungs are clear. No pleural effusion or pneumothorax. No acute skeletal abnormality. Right upper quadrant cholecystectomy clips. IMPRESSION: No active cardiopulmonary disease. Electronically Signed   By: Neita Garnet M.D.   On: 05/11/2023 12:51    Procedures Procedures    Medications Ordered in ED Medications  albuterol (VENTOLIN HFA) 108 (90 Base) MCG/ACT inhaler 2 puff (has no administration in time range)  sodium chloride  0.9 % bolus 1,000 mL (0 mLs Intravenous Stopped 05/11/23 1330)  ondansetron (ZOFRAN) injection 4 mg (4 mg Intravenous Given 05/11/23 1209)  sodium chloride 0.9 % bolus 1,000 mL (1,000 mLs Intravenous New Bag/Given 05/11/23 1354)    ED Course/ Medical Decision Making/ A&P                                 Medical Decision Making Amount and/or Complexity of Data Reviewed Labs: ordered. Radiology: ordered.  Risk Prescription drug management.   This patient presents to the ED for concern of n/v, this involves an extensive number of treatment options, and is a complaint that carries with it a high risk of complications and morbidity.  The differential diagnosis includes pregnancy, hydrocephalus, electrolyte abn   Co morbidities that complicate the patient evaluation  arnold chiari malformation   Additional history obtained:  Additional history obtained from epic chart review  Lab Tests:  I Ordered, and personally interpreted labs.  The pertinent results include:  cmp nl, cbc nl other than hgb 10.8 (stable); ua + ketones, no infection, + preg   Imaging Studies ordered:  I ordered imaging studies including cxr, ct head  I independently  visualized and interpreted imaging which showed  CXR: No active cardiopulmonary disease.  CT head: No acute intracranial process.  2. Status post prior suboccipital craniectomy with redemonstrated  low lying cerebellar tonsils without increased crowding.   I agree with the radiologist interpretation   Cardiac Monitoring:  The patient was maintained on a cardiac monitor.  I personally viewed and interpreted the cardiac monitored which showed an underlying rhythm of: nsr   Medicines ordered and prescription drug management:  I ordered medication including ivfs/zofran/ivfs  for sx  Reevaluation of the patient after these medicines showed that the patient improved I have reviewed the patients home medicines and have made adjustments as needed   Critical Interventions:  IVFs   Problem List / ED Course:  N/v/d:  likely from early pregnancy.  Pt plans for medical abortion.  Pt's ventricles don't look enlarged.  She is feeling better and is now tolerating fluids.  She is to return if worse.  F/u with pcp.   Reevaluation:  After the interventions noted above, I reevaluated the patient and found that they have :improved   Social Determinants of Health:  Lives at home   Dispostion:  After consideration of the diagnostic results and the patients response to treatment, I feel that the patent would benefit from discharge with outpatient f/u.          Final Clinical Impression(s) / ED Diagnoses Final diagnoses:  Less than [redacted] weeks gestation of pregnancy  Dehydration  Nausea and vomiting, unspecified vomiting type    Rx / DC Orders ED Discharge Orders          Ordered    ondansetron (ZOFRAN-ODT) 4 MG disintegrating tablet  Every 8 hours PRN        05/11/23 1513              Jacalyn Lefevre, MD 05/11/23 1514

## 2023-05-11 NOTE — ED Triage Notes (Signed)
Pt c/o dizzy, nauseous, "haven't been able to eat" x3-4 days. Associated SHOB, "chest is tight & I get really dizzy."  [redacted]wks pregnant- states they gave her the medication for abortion, "I can't have a baby."

## 2023-05-11 NOTE — ED Notes (Signed)
Dc instructions reviewed with patient. Patient voiced understanding. Dc with belongings.  °

## 2023-05-11 NOTE — ED Notes (Signed)
Pt still cannot void for urine sample per pt.

## 2023-07-26 ENCOUNTER — Encounter (HOSPITAL_COMMUNITY): Payer: Self-pay | Admitting: Psychiatry

## 2023-07-26 ENCOUNTER — Emergency Department (HOSPITAL_COMMUNITY)
Admission: EM | Admit: 2023-07-26 | Discharge: 2023-07-26 | Disposition: A | Discharge status: Home / Self Care | Attending: Emergency Medicine | Admitting: Emergency Medicine

## 2023-07-26 ENCOUNTER — Other Ambulatory Visit: Payer: Self-pay

## 2023-07-26 DIAGNOSIS — R4689 Other symptoms and signs involving appearance and behavior: Secondary | ICD-10-CM | POA: Diagnosis not present

## 2023-07-26 DIAGNOSIS — R456 Violent behavior: Secondary | ICD-10-CM | POA: Insufficient documentation

## 2023-07-26 DIAGNOSIS — Z638 Other specified problems related to primary support group: Secondary | ICD-10-CM

## 2023-07-26 DIAGNOSIS — R45851 Suicidal ideations: Secondary | ICD-10-CM

## 2023-07-26 DIAGNOSIS — F309 Manic episode, unspecified: Secondary | ICD-10-CM | POA: Insufficient documentation

## 2023-07-26 DIAGNOSIS — S4991XA Unspecified injury of right shoulder and upper arm, initial encounter: Secondary | ICD-10-CM | POA: Diagnosis present

## 2023-07-26 DIAGNOSIS — X789XXA Intentional self-harm by unspecified sharp object, initial encounter: Secondary | ICD-10-CM | POA: Diagnosis not present

## 2023-07-26 DIAGNOSIS — F22 Delusional disorders: Secondary | ICD-10-CM | POA: Insufficient documentation

## 2023-07-26 DIAGNOSIS — S40021A Contusion of right upper arm, initial encounter: Secondary | ICD-10-CM | POA: Diagnosis not present

## 2023-07-26 DIAGNOSIS — Z639 Problem related to primary support group, unspecified: Secondary | ICD-10-CM | POA: Diagnosis not present

## 2023-07-26 DIAGNOSIS — Z79899 Other long term (current) drug therapy: Secondary | ICD-10-CM | POA: Insufficient documentation

## 2023-07-26 LAB — COMPREHENSIVE METABOLIC PANEL
ALT: 23 U/L (ref 0–44)
AST: 23 U/L (ref 15–41)
Albumin: 4.1 g/dL (ref 3.5–5.0)
Alkaline Phosphatase: 63 U/L (ref 38–126)
Anion gap: 13 (ref 5–15)
BUN: 12 mg/dL (ref 6–20)
CO2: 23 mmol/L (ref 22–32)
Calcium: 8.8 mg/dL — ABNORMAL LOW (ref 8.9–10.3)
Chloride: 101 mmol/L (ref 98–111)
Creatinine, Ser: 0.78 mg/dL (ref 0.44–1.00)
GFR, Estimated: >60 mL/min (ref >60)
Glucose, Bld: 93 mg/dL (ref 70–99)
Potassium: 3.3 mmol/L — ABNORMAL LOW (ref 3.5–5.1)
Sodium: 137 mmol/L (ref 135–145)
Total Bilirubin: 1 mg/dL (ref 0.0–1.2)
Total Protein: 7.5 g/dL (ref 6.5–8.1)

## 2023-07-26 LAB — HCG, QUANTITATIVE, PREGNANCY: hCG, Beta Chain, Quant, S: 1 m[IU]/mL (ref <5)

## 2023-07-26 LAB — CBC
HCT: 32.2 % — ABNORMAL LOW (ref 36.0–46.0)
Hemoglobin: 9.1 g/dL — ABNORMAL LOW (ref 12.0–15.0)
MCH: 20.3 pg — ABNORMAL LOW (ref 26.0–34.0)
MCHC: 28.3 g/dL — ABNORMAL LOW (ref 30.0–36.0)
MCV: 71.9 fL — ABNORMAL LOW (ref 80.0–100.0)
Platelets: 561 10*3/uL — ABNORMAL HIGH (ref 150–400)
RBC: 4.48 MIL/uL (ref 3.87–5.11)
RDW: 18.9 % — ABNORMAL HIGH (ref 11.5–15.5)
WBC: 15.3 10*3/uL — ABNORMAL HIGH (ref 4.0–10.5)
nRBC: 0 % (ref 0.0–0.2)

## 2023-07-26 LAB — SALICYLATE LEVEL: Salicylate Lvl: <7 mg/dL — ABNORMAL LOW (ref 7.0–30.0)

## 2023-07-26 LAB — RAPID URINE DRUG SCREEN, HOSP PERFORMED
Amphetamines: NOT DETECTED
Barbiturates: NOT DETECTED
Benzodiazepines: POSITIVE — AB
Cocaine: NOT DETECTED
Opiates: NOT DETECTED
Tetrahydrocannabinol: POSITIVE — AB

## 2023-07-26 LAB — HCG, SERUM, QUALITATIVE: Preg, Serum: NEGATIVE

## 2023-07-26 LAB — ACETAMINOPHEN LEVEL: Acetaminophen (Tylenol), Serum: <10 ug/mL — ABNORMAL LOW (ref 10–30)

## 2023-07-26 LAB — ETHANOL: Alcohol, Ethyl (B): <10 mg/dL (ref <10)

## 2023-07-26 MED ORDER — ALBUTEROL SULFATE HFA 108 (90 BASE) MCG/ACT IN AERS: 2.0000 | INHALATION_SPRAY | RESPIRATORY_TRACT | Status: DC | PRN

## 2023-07-26 MED ORDER — IBUPROFEN 200 MG PO TABS: 600.0000 mg | ORAL_TABLET | Freq: Three times a day (TID) | ORAL | Status: DC | PRN

## 2023-07-26 MED ORDER — ACETAMINOPHEN 325 MG PO TABS: 650.0000 mg | ORAL_TABLET | Freq: Four times a day (QID) | ORAL | Status: DC | PRN

## 2023-07-26 MED ORDER — VENLAFAXINE HCL ER 37.5 MG PO CP24: 37.5000 mg | ORAL_CAPSULE | Freq: Every day | ORAL | Status: DC
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
## 2023-07-26 NOTE — ED Provider Notes (Signed)
 Patient is evaluated by psychiatry team after 12 hours observation in the ED; she was felt to be reasonably safe and stable for discharge with outpatient psychiatric follow-up at this time.  Patient is recommendation, IVC was rescinded by myself.  Outpatient resources were provided for the patient and she is encouraged to follow-up as an outpatient for mental health concerns, and to return to the ED if she has acute suicidal ideation.   Terald Sleeper, MD 07/26/23 [phone]
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
## 2023-07-26 NOTE — Discharge Instructions (Addendum)
 You were seen by our psychiatry service in the hospital today.  You were temporarily placed under an involuntary commitment order, which was later revoked.  Our psychiatry team strongly encourages you to follow-up as an outpatient with a counselor for your mental health.  You can schedule an appointment at the number above.  There is also 24-hour crisis center at the number above if you have a mental health crisis.  You can always return to this emergency department as well if you are having thoughts of hurting yourself.
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
## 2023-07-26 NOTE — ED Notes (Signed)
 Patient belonging bag  in Triage cabinet.
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
## 2023-08-10 ENCOUNTER — Ambulatory Visit: Payer: Medicaid Other | Admitting: Neurology

## 2023-10-26 NOTE — Telephone Encounter (Cosign Needed)
 error

## 2023-11-17 NOTE — Progress Notes (Signed)
 [address] - AMBULATORY [facility]  - FAMILY MEDICINE [address] [place] [address]   Beth Key  08-25-2000  Assessment and Plan:   Beth Key was seen today for add and potential exposure to std.  Diagnoses and all orders for this visit:  Attention or concentration deficit -     dextroamphetamine-amphetamine (ADDERALL) 10 mg tablet; Take 1 tablet (10 mg total) by mouth 2 (two) times a day. -     12+Oxycodone +Crt-Unbund; Future  Potential exposure to STD -     Rapid Plasma Reagin (RPR), Qualitative Test with Reflex to Titer and Confirmation; Future -     Chlamydia / Gonococcus (GC), NAAT; Future -     HIV Screen with Reflex to Confirmation; Future -     Herpes Simplex Virus (HSV) Types 1/2 Specific Antibodies, IgG; Future    Subjective:   Chief Complaint  Patient presents with  . ADD  . Potential exposure to STD    HPI:  Patient comes in today to discuss ADHD and would to discuss changing her Effexor . -States she does not feel like it is working well.  She states she she took vyvanse and adderall as a teenage but did not take her medication as prescribed.  She states that she cannot remember if one worked better than the other, her grades were much better when she took the medicines, her demeanor was better according to her parents but she got kicked out of school and stopped taking the medicines anyway. She is now needing medication help her get things done.  States she is living on her own with her son and can not get things done like they should be. Symptoms started years ago She has tried vyvanse and adderall for the problem with benefit. She has not tried any other medication to help with mood.  Has a history of eating disorder.   The patient states that she has been on other medicines to help with mood, but she feels like the anxiety component has been driven more by the fact that she cannot get  things done.  She states that she makes lists and has exciting thoughts of what she wants to try to get done almost a dopamine surge but then when it comes time to actually complete the tasks, she just cannot complete anything.  Needs STD testing  States she recently broke up with her girlfriend and would like to make sure she is clean.   She is not taking over the counter supplements. See list in medications.    She verbalizes understanding of their current diagnoses, medications and therapies.   Past Medical/Surgical History:   Problem List[1] Surgical History[2]  Family History:   Family History[3]  Social History:   Social History[4]  Allergies:   Metoclopramide , Penicillins, and Promethazine   Current Medications:   Current Medications[5]  ROS:   Review of Systems  Constitution: No for chills, fever. Stable appetite, no change in weight. No malaise/fatigue. Cardiovascular: no chest pain, orthopnea, palpitations. no leg edema. Respiratory:  no cough. no sputum production, shortness of breath, wheezing.   GI: no abdominal pain, no bowel changes. Skin: normal Psychiatric/Behavioral: No new mood changes. No sleep changes. All other review of systems not addressed above are negative or unchanged.   Objective:   BP 101/68   Pulse (!) 115   Resp 16   Ht 1.715 m (5' 7.5)   Wt 86.6 kg (191 lb)   SpO2  99%   BMI 29.47 kg/m   Physical Exam  Constitutional: Pt. appears well nourished, well developed. No acute distress Head: normocephalic, atraumatic.  Cardiovascular: Normal rate, regular rhythm. normal heart sounds.  Pulmonary/Chest: Effort normal. Breath sounds clear in all fields. Neurological:  CN intact. no tremor Skin: Warm and dry.  Psychiatric: Alert and oriented to person, place, and time. Affect and responses appear pleasant and polite.  Stable sleep pattern    Vitals reviewed  PLAN: 1. Attention or concentration deficit (Primary) We did talk about  the fact that ADD medicines can potentially drive anxiety but her description seems to put more emphasis on the anxiety being driven by her inability to complete any task.  Her description does seem very likely to be ADD in nature, but because there is some history of substance abuse, I will confirm the start of a controlled substance with the UDS. - dextroamphetamine-amphetamine (ADDERALL) 10 mg tablet; Take 1 tablet (10 mg total) by mouth 2 (two) times a day.  Dispense: 60 tablet; Refill: 0 - 12+Oxycodone +Crt-Unbund; Future  2. Potential exposure to STD We will make sure that there are no positives - Rapid Plasma Reagin (RPR), Qualitative Test with Reflex to Titer and Confirmation; Future - Chlamydia / Gonococcus (GC), NAAT; Future - HIV Screen with Reflex to Confirmation; Future - Herpes Simplex Virus (HSV) Types 1/2 Specific Antibodies, IgG; Future  ADD medications reviewed and patient understands their use and directions.  Goals to care reviewed.  She has no barriers to meeting goals discussed.   Return in about 20 days (around 12/07/2023) for ADD- VV.   I agree the documentation is accurate and complete.  Electronically signed by: Delon Barrio, CMA 11/17/2023 1:16 PM    This document serves as a record of services personally performed by Suzann Hedgecock,PA-C. It was created on their behalf by Delon Barrio, CMA, a trained medical scribe, and Registered Medical Assistant (RMA). During the course of documenting the history, physical exam and medical decision making, I was functioning as a Stage manager. The creation of this record is the provider's dictation and/or activities during the visit.                Electronically signed by: Delon Barrio, CMA 11/17/2023 1:17 PM  Note: This document was created using the aid of voice recognition Dragon dictation software.       [1] Patient Active Problem List Diagnosis  . Disordered eating  . Menorrhagia with irregular cycle  .  Gastroesophageal reflux disease without esophagitis  . Nicotine abuse  . Substance abuse (HCC)  . Panic disorder  . Migraine without aura and without status migrainosus, not intractable  . Arnold-Chiari malformation, type I (HCC)  . Bilateral headache  . Intolerant of cold  . Polyuria  . Generalized anxiety disorder  . Nausea  . Syrinx of spinal cord    (CMD)  [2] Past Surgical History: Procedure Laterality Date  . BRAIN SURGERY  01/2021   Procedure: BRAIN SURGERY  . CHOLECYSTECTOMY     Procedure: CHOLECYSTECTOMY  [3] Family History Problem Relation Name Age of Onset  . Lupus Mother    . Breast cancer Mother    [4] Social History Socioeconomic History  . Marital status: Single  Tobacco Use  . Smoking status: Never  . Smokeless tobacco: Current  . Tobacco comments:    Vape  Substance and Sexual Activity  . Alcohol use: No  . Drug use: Not Currently    Comment: Drug use: Drug Use:  No; history of MJ use  . Sexual activity: Yes    Partners: Male, Female   Social Drivers of Health    Received from [facility]   HITS  [5] Current Outpatient Medications  Medication Sig Dispense Refill  . dextroamphetamine-amphetamine (ADDERALL) 10 mg tablet Take 1 tablet (10 mg total) by mouth 2 (two) times a day. 60 tablet 0  . omeprazole (PriLOSEC) 40 mg DR capsule TAKE 1 CAPSULE (40 MG TOTAL) BY MOUTH IN THE MORNING 90 capsule 0  . venlafaxine  (EFFEXOR  XR) 37.5 mg 24 hr capsule Take 1 capsule (37.5 mg total) by mouth nightly. (Patient not taking: Reported on 11/17/2023) 90 capsule 1   No current facility-administered medications for this visit.

## 2023-11-24 ENCOUNTER — Emergency Department (HOSPITAL_BASED_OUTPATIENT_CLINIC_OR_DEPARTMENT_OTHER)

## 2023-11-24 ENCOUNTER — Other Ambulatory Visit: Payer: Self-pay

## 2023-11-24 ENCOUNTER — Encounter (HOSPITAL_BASED_OUTPATIENT_CLINIC_OR_DEPARTMENT_OTHER): Payer: Self-pay

## 2023-11-24 ENCOUNTER — Observation Stay (HOSPITAL_BASED_OUTPATIENT_CLINIC_OR_DEPARTMENT_OTHER)
Admission: EM | Admit: 2023-11-24 | Discharge: 2023-11-25 | Disposition: A | Discharge status: Home / Self Care | Attending: Family Medicine | Admitting: Family Medicine

## 2023-11-24 DIAGNOSIS — M25511 Pain in right shoulder: Secondary | ICD-10-CM | POA: Diagnosis not present

## 2023-11-24 DIAGNOSIS — J45909 Unspecified asthma, uncomplicated: Secondary | ICD-10-CM | POA: Insufficient documentation

## 2023-11-24 DIAGNOSIS — R000 Tachycardia, unspecified: Secondary | ICD-10-CM | POA: Diagnosis not present

## 2023-11-24 DIAGNOSIS — E538 Deficiency of other specified B group vitamins: Secondary | ICD-10-CM | POA: Insufficient documentation

## 2023-11-24 DIAGNOSIS — F12920 Cannabis use, unspecified with intoxication, uncomplicated: Secondary | ICD-10-CM | POA: Diagnosis not present

## 2023-11-24 DIAGNOSIS — D649 Anemia, unspecified: Secondary | ICD-10-CM | POA: Diagnosis not present

## 2023-11-24 DIAGNOSIS — F17210 Nicotine dependence, cigarettes, uncomplicated: Secondary | ICD-10-CM | POA: Insufficient documentation

## 2023-11-24 DIAGNOSIS — D509 Iron deficiency anemia, unspecified: Principal | ICD-10-CM | POA: Insufficient documentation

## 2023-11-24 DIAGNOSIS — D529 Folate deficiency anemia, unspecified: Secondary | ICD-10-CM | POA: Insufficient documentation

## 2023-11-24 DIAGNOSIS — R0789 Other chest pain: Secondary | ICD-10-CM

## 2023-11-24 DIAGNOSIS — R079 Chest pain, unspecified: Secondary | ICD-10-CM | POA: Diagnosis present

## 2023-11-24 LAB — TROPONIN T, HIGH SENSITIVITY
Troponin T High Sensitivity: <15 ng/L (ref <19)
Troponin T High Sensitivity: <15 ng/L (ref <19)

## 2023-11-24 LAB — CBC
HCT: 26.9 % — ABNORMAL LOW (ref 36.0–46.0)
Hemoglobin: 7.3 g/dL — ABNORMAL LOW (ref 12.0–15.0)
MCH: 16.9 pg — ABNORMAL LOW (ref 26.0–34.0)
MCHC: 27.1 g/dL — ABNORMAL LOW (ref 30.0–36.0)
MCV: 62.3 fL — ABNORMAL LOW (ref 80.0–100.0)
Platelets: 327 K/uL (ref 150–400)
RBC: 4.32 MIL/uL (ref 3.87–5.11)
RDW: 18.4 % — ABNORMAL HIGH (ref 11.5–15.5)
WBC: 9.2 K/uL (ref 4.0–10.5)
nRBC: 0 % (ref 0.0–0.2)

## 2023-11-24 LAB — BASIC METABOLIC PANEL WITH GFR
Anion gap: 13 (ref 5–15)
BUN: 12 mg/dL (ref 6–20)
CO2: 22 mmol/L (ref 22–32)
Calcium: 9.4 mg/dL (ref 8.9–10.3)
Chloride: 101 mmol/L (ref 98–111)
Creatinine, Ser: 0.63 mg/dL (ref 0.44–1.00)
GFR, Estimated: >60 mL/min (ref >60)
Glucose, Bld: 95 mg/dL (ref 70–99)
Potassium: 3.8 mmol/L (ref 3.5–5.1)
Sodium: 135 mmol/L (ref 135–145)

## 2023-11-24 LAB — HEPATIC FUNCTION PANEL
ALT: 19 U/L (ref 0–44)
AST: 20 U/L (ref 15–41)
Albumin: 4.4 g/dL (ref 3.5–5.0)
Alkaline Phosphatase: 71 U/L (ref 38–126)
Bilirubin, Direct: 0.2 mg/dL (ref 0.0–0.2)
Indirect Bilirubin: 0.3 mg/dL (ref 0.3–0.9)
Total Bilirubin: 0.5 mg/dL (ref 0.0–1.2)
Total Protein: 6.8 g/dL (ref 6.5–8.1)

## 2023-11-24 LAB — OCCULT BLOOD X 1 CARD TO LAB, STOOL: Fecal Occult Bld: NEGATIVE

## 2023-11-24 LAB — IRON AND TIBC
Iron: 33 ug/dL (ref 28–170)
Saturation Ratios: 5 % — ABNORMAL LOW (ref 10.4–31.8)
TIBC: 617 ug/dL — ABNORMAL HIGH (ref 250–450)
UIBC: 584 ug/dL

## 2023-11-24 LAB — PREPARE RBC (CROSSMATCH)

## 2023-11-24 LAB — VITAMIN B12: Vitamin B-12: 281 pg/mL (ref 180–914)

## 2023-11-24 LAB — D-DIMER, QUANTITATIVE: D-Dimer, Quant: <0.27 ug{FEU}/mL (ref 0.00–0.50)

## 2023-11-24 LAB — LIPASE, BLOOD: Lipase: 24 U/L (ref 11–51)

## 2023-11-24 LAB — RETICULOCYTES
Immature Retic Fract: 26.2 % — ABNORMAL HIGH (ref 2.3–15.9)
RBC.: 4.58 MIL/uL (ref 3.87–5.11)
Retic Count, Absolute: 80.2 K/uL (ref 19.0–186.0)
Retic Ct Pct: 1.8 % (ref 0.4–3.1)

## 2023-11-24 LAB — PREGNANCY, URINE: Preg Test, Ur: NEGATIVE

## 2023-11-24 LAB — HIV ANTIBODY (ROUTINE TESTING W REFLEX): HIV Screen 4th Generation wRfx: NONREACTIVE

## 2023-11-24 LAB — FERRITIN: Ferritin: 3 ng/mL — ABNORMAL LOW (ref 11–307)

## 2023-11-24 LAB — FOLATE: Folate: 19.1 ng/mL (ref >5.9)

## 2023-11-24 MED ORDER — GABAPENTIN 100 MG PO CAPS: 200.0000 mg | ORAL_CAPSULE | Freq: Two times a day (BID) | ORAL | Status: DC

## 2023-11-24 MED ORDER — OXYCODONE HCL 5 MG PO TABS
5.0000 mg | ORAL_TABLET | ORAL | Status: DC | PRN
  Administered 2023-11-24: 5 mg via ORAL
  Filled 2023-11-24: qty 1

## 2023-11-24 MED ORDER — SODIUM CHLORIDE 0.9% IV SOLUTION: Freq: Once | INTRAVENOUS | Status: AC

## 2023-11-24 MED ORDER — MORPHINE SULFATE (PF) 4 MG/ML IV SOLN
4.0000 mg | Freq: Once | INTRAVENOUS | Status: AC
  Administered 2023-11-24: 4 mg via INTRAVENOUS
  Filled 2023-11-24: qty 1

## 2023-11-24 MED ORDER — ACETAMINOPHEN 325 MG PO TABS
650.0000 mg | ORAL_TABLET | Freq: Four times a day (QID) | ORAL | Status: DC | PRN
  Administered 2023-11-24: 650 mg via ORAL
  Filled 2023-11-24: qty 2

## 2023-11-24 MED ORDER — ONDANSETRON HCL 4 MG/2ML IJ SOLN
4.0000 mg | Freq: Once | INTRAMUSCULAR | Status: AC
  Administered 2023-11-24: 4 mg via INTRAVENOUS
  Filled 2023-11-24: qty 2

## 2023-11-24 MED ORDER — GABAPENTIN 100 MG PO CAPS
200.0000 mg | ORAL_CAPSULE | Freq: Two times a day (BID) | ORAL | Status: DC
  Administered 2023-11-24 – 2023-11-25 (×2): 200 mg via ORAL
  Filled 2023-11-24 (×2): qty 2

## 2023-11-24 MED ORDER — SODIUM CHLORIDE 0.9 % IV BOLUS
1000.0000 mL | Freq: Once | INTRAVENOUS | Status: AC
  Administered 2023-11-24: 1000 mL via INTRAVENOUS

## 2023-11-24 MED ORDER — ALBUTEROL SULFATE (2.5 MG/3ML) 0.083% IN NEBU: 2.5000 mg | INHALATION_SOLUTION | RESPIRATORY_TRACT | Status: DC | PRN

## 2023-11-24 MED ORDER — OXYCODONE HCL 5 MG PO TABS
10.0000 mg | ORAL_TABLET | ORAL | Status: DC | PRN
  Administered 2023-11-24 – 2023-11-25 (×4): 10 mg via ORAL
  Filled 2023-11-24 (×4): qty 2

## 2023-11-24 MED ORDER — FENTANYL CITRATE PF 50 MCG/ML IJ SOSY
50.0000 ug | PREFILLED_SYRINGE | Freq: Once | INTRAMUSCULAR | Status: AC
  Administered 2023-11-24: 50 ug via INTRAVENOUS
  Filled 2023-11-24: qty 1

## 2023-11-24 MED ORDER — TRAZODONE HCL 50 MG PO TABS: 25.0000 mg | ORAL_TABLET | Freq: Every evening | ORAL | Status: DC | PRN

## 2023-11-24 MED ORDER — FERROUS SULFATE 325 (65 FE) MG PO TBEC
325.0000 mg | DELAYED_RELEASE_TABLET | Freq: Two times a day (BID) | ORAL | 0 refills | Status: AC
Start: 2023-11-24 — End: 2024-02-22

## 2023-11-24 NOTE — ED Triage Notes (Signed)
 Pt states around 1830 today she started having chest tightness that comes & goes, has SOB with excursion & has pain in her right arm. Pt states she took 5mg  adderall today for the first time around 1730. Pt also states she vomited multiple times while at work between 1830 - 2030 this evening.

## 2023-11-24 NOTE — H&P (Signed)
 Same-day admit history and Physical and discharge note  Beth Key FMW:[id number] DOB: 2000-07-26 DOA: 11/24/2023  PCP: Nena Rosina CROME, PA-C   Chief Complaint: Tachycardia, chest pain  HPI: Beth Key is a 23 y.o. female with medical history significant for ADHD, anxiety, GERD and chronic anemia who was admitted to the hospitalist service due to tachycardia and chest pain.  Patient states that she was prescribed Adderall a few days ago by her PCP.  She took the first dose yesterday afternoon, and went to work.  About an hour after taking this medication, she had sudden onset of palpitations, diaphoresis, anxiety, chest discomfort as well as right shoulder discomfort.  The symptoms lasted a total of about 12 hours, and have finally subsided this morning.  States that she is tired all the time, and does eat ice often.  Although she feels tired all the time, she does not have chest pain or dizziness with ambulation.  She still has a little bit of shoulder pain, but her chest discomfort even with ambulation has completely subsided.  She denies any dark or tarry stools, any new abdominal pain, nausea or vomiting.  She denies any abnormal uterine bleeding.  States that her menses is quite irregular, but when it happens it is quite short without heavy bleeding.  She denies any recent weight loss, nausea or vomiting.  Review of Systems: Please see HPI for pertinent positives and negatives. A complete 10 system review of systems are otherwise negative.  Past Medical History:  Diagnosis Date   Acute pancreatitis 10/08/2013   ADHD (attention deficit hyperactivity disorder)    Anxiety    Asthma    Last asthmatic episode approx. 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 self-diagnosis   Gallstones 10/15/2013   GERD (gastroesophageal reflux disease)    Headache(784.0)    Symptomatic cholelithiasis 12/31/2013   Past Surgical History:   Procedure Laterality Date   CHOLECYSTECTOMY N/A 12/31/2013   Procedure: LAPAROSCOPIC CHOLECYSTECTOMY ;  Surgeon: CHRISTELLA. Julietta Millman, MD;  Location: [facility] OR;  Service: Pediatrics;  Laterality: N/A;   SUBOCCIPITAL CRANIECTOMY CERVICAL LAMINECTOMY N/A 02/16/2021   Procedure: CHIARI DECOMPRESSION WITH Cervical one LAMINECTOMY AND DURAPLASTY;  Surgeon: Dawley, Lani BROCKS, DO;  Location: [facility] OR;  Service: Neurosurgery;  Laterality: N/A;   WISDOM TOOTH EXTRACTION     Social History:  reports that she has been smoking e-cigarettes. She has never used smokeless tobacco. She reports that she does not currently use alcohol. She reports current drug use. Drug: Marijuana.  Allergies  Allergen Reactions   Penicillins Rash and Other (See Comments)   Promethazine  Other (See Comments)    Dystonic reaction to IV Promethazine  (jerky movements)   Reglan  [Metoclopramide ] Other (See Comments)    Made her jittery    Family History  Adopted: Yes  Problem Relation Age of Onset   Alcohol abuse Mother    Depression Mother    Drug abuse Mother    Heart disease Maternal Grandmother    Hypertension Maternal Grandmother    Depression Maternal Grandmother    Heart disease Maternal Aunt    Hypertension Maternal Aunt    Depression Maternal Aunt      Prior to Admission medications   Medication Sig Start Date End Date Taking? Authorizing Provider  acetaminophen  (TYLENOL ) 500 MG tablet Take 500-1,000 mg by mouth every 6 (six) hours as needed for moderate pain (pain score 4-6).  Yes [provider]  amphetamine-dextroamphetamine (ADDERALL) 10 MG tablet Take 10 mg by mouth 2 (two) times daily.   Yes [provider]  ferrous sulfate  325 (65 FE) MG EC tablet Take 1 tablet (325 mg total) by mouth 2 (two) times daily. 11/24/23 02/22/24 Yes Zella, Thompson Mckim M, MD    Physical Exam: BP 119/75 (BP Location: Left Arm)   Pulse 92   Temp 98.2 F (36.8 C) (Oral)   Resp 20   Ht 5' 7 (1.702 m)   Wt 86.6 kg   LMP  10/25/2023 (Approximate)   SpO2 100%   Breastfeeding Unknown   BMI 29.91 kg/m  General:  Alert, oriented, calm, in no acute distress, resting comfortably and is on the phone. Cardiovascular: RRR, no murmurs or rubs, no peripheral edema  Respiratory: clear to auscultation bilaterally, no wheezes, no crackles  Abdomen: soft, nontender, nondistended, normal bowel tones heard  Skin: dry, no rashes  Musculoskeletal: no joint effusions, normal range of motion  Psychiatric: appropriate affect, normal speech  Neurologic: extraocular muscles intact, clear speech, moving all extremities with intact sensorium         Labs on Admission:  Basic Metabolic Panel: Recent Labs  Lab 11/24/23 0224  NA 135  K 3.8  CL 101  CO2 22  GLUCOSE 95  BUN 12  CREATININE 0.63  CALCIUM  9.4   Liver Function Tests: Recent Labs  Lab 11/24/23 0223  AST 20  ALT 19  ALKPHOS 71  BILITOT 0.5  PROT 6.8  ALBUMIN 4.4   Recent Labs  Lab 11/24/23 0223  LIPASE 24   No results for input(s): AMMONIA in the last 168 hours. CBC: Recent Labs  Lab 11/24/23 0224  WBC 9.2  HGB 7.3*  HCT 26.9*  MCV 62.3*  PLT 327   Cardiac Enzymes: No results for input(s): CKTOTAL, CKMB, CKMBINDEX, TROPONINI in the last 168 hours. BNP (last 3 results) No results for input(s): BNP in the last 8760 hours.  ProBNP (last 3 results) No results for input(s): PROBNP in the last 8760 hours.  CBG: No results for input(s): GLUCAP in the last 168 hours.  Radiological Exams on Admission: DG Shoulder Right Result Date: 11/24/2023 CLINICAL DATA:  23 year old female with intermittent chest pain, shortness of breath, right upper extremity pain. EXAM: RIGHT SHOULDER - 2+ VIEW COMPARISON:  Chest radiographs 0229 hours today. Three views at 0452 hours. FINDINGS: Bone mineralization is within normal limits. There is no evidence of fracture or dislocation. There is no evidence of arthropathy or other focal bone abnormality.  Negative visible right ribs and chest. IMPRESSION: Negative. Electronically Signed   By: VEAR Hurst M.D.   On: 11/24/2023 06:24   DG Chest 2 View Result Date: 11/24/2023 CLINICAL DATA:  Intermittent chest tightness with shortness of breath. EXAM: CHEST - 2 VIEW COMPARISON:  May 11, 2023 FINDINGS: The heart size and mediastinal contours are within normal limits. Both lungs are clear. The visualized skeletal structures are unremarkable. IMPRESSION: No active cardiopulmonary disease. Electronically Signed   By: Suzen Dials M.D.   On: 11/24/2023 02:38   Assessment/Plan Beth Key is a 23 y.o. female with medical history significant for ADHD, anxiety, GERD and chronic anemia who was admitted to the hospitalist service due to tachycardia and chest pain.  Workup is negative, chest pain and tachycardia have completely resolved.  Likely due to intolerance of Adderall, she was advised not to take this medication again.  Lab work also reveals iron deficiency anemia.  She  is able to ambulate without chest pain, or dizziness.  Though she has had some chronic tiredness which I am sure is related to her anemia.  She was noted to have a hemoglobin of 9 back in March, but she was not aware of this.  As the patient is comfortable and now asymptomatic with hemoglobin greater than 7 she is agreeable to discharge home from the hospital.  She was prescribed iron supplements and advised to follow-up closely with her primary care provider to trend her hemoglobin.  She is agreeable to this plan, all questions were answered to her satisfaction and she is requesting discharge home from the hospital this morning.  Time spent: 48 minutes  Irelynd Zumstein CHRISTELLA Gail MD [facility] Pager [phone]  If 7PM-7AM, please contact night-coverage [email or web address] Password [facility]  11/24/2023, 12:23 PM

## 2023-11-24 NOTE — ED Notes (Signed)
 ED TO INPATIENT HANDOFF REPORT  ED Nurse Name and Phone #: Thornell PEAK [phone]  S Name/Age/Gender Beth Key 23 y.o. female Room/Bed: MH11/MH11  Code Status   Code Status: Prior  Home/SNF/Other Home Patient oriented to: self, place, time, and situation Is this baseline? Yes   Triage Complete: Triage complete  Chief Complaint Symptomatic anemia [D64.9]  Triage Note Pt states around 1830 today she started having chest tightness that comes & goes, has SOB with excursion & has pain in her right arm. Pt states she took 5mg  adderall today for the first time around 1730. Pt also states she vomited multiple times while at work between 1830 - 2030 this evening.      Allergies Allergies  Allergen Reactions   Penicillins Rash and Other (See Comments)    Has patient had a PCN reaction causing immediate rash, facial/tongue/throat swelling, SOB or lightheadedness with hypotension: Yes Has patient had a PCN reaction causing severe rash involving mucus membranes or skin necrosis: No Has patient had a PCN reaction that required hospitalization: No Has patient had a PCN reaction occurring within the last 10 years: No If all of the above answers are NO, then may proceed with Cephalosporin use.    Promethazine  Other (See Comments)    Dystonic reaction to IV Promethazine  (jerky movements)   Reglan  [Metoclopramide ] Other (See Comments)    Made her jittery    Level of Care/Admitting Diagnosis ED Disposition     ED Disposition  Admit   Condition  --   Comment  Hospital Area: [facility] [[id number]]  Level of Care: Telemetry [5]  Admit to tele based on following criteria: Other see comments  Comments: Close monitoring  Interfacility transfer: Yes  May place patient in observation at [facility] or [facility] if equivalent level of care is available:: Yes  Covid Evaluation: Asymptomatic - no recent exposure (last 10 days) testing not required  Diagnosis:  Symptomatic anemia [8671310]  Admitting Physician: BOBBETTE HERCULES [[id number]]  Attending Physician: CARITA SENIOR [[id number]]          B Medical/Surgery History Past Medical History:  Diagnosis Date   Acute pancreatitis 10/08/2013   ADHD (attention deficit hyperactivity disorder)    Anxiety    Asthma    Last asthmatic episode approx. 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 self-diagnosis   Gallstones 10/15/2013   GERD (gastroesophageal reflux disease)    Headache(784.0)    Symptomatic cholelithiasis 12/31/2013   Past Surgical History:  Procedure Laterality Date   CHOLECYSTECTOMY N/A 12/31/2013   Procedure: LAPAROSCOPIC CHOLECYSTECTOMY ;  Surgeon: CHRISTELLA. Julietta Millman, MD;  Location: [facility] OR;  Service: Pediatrics;  Laterality: N/A;   SUBOCCIPITAL CRANIECTOMY CERVICAL LAMINECTOMY N/A 02/16/2021   Procedure: CHIARI DECOMPRESSION WITH Cervical one LAMINECTOMY AND DURAPLASTY;  Surgeon: Dawley, Lani BROCKS, DO;  Location: [facility] OR;  Service: Neurosurgery;  Laterality: N/A;   WISDOM TOOTH EXTRACTION       A IV Location/Drains/Wounds Patient Lines/Drains/Airways Status     Active Line/Drains/Airways     Name Placement date Placement time Site Days   Peripheral IV 11/24/23 20 G 1 Right Antecubital 11/24/23  0405  Antecubital  less than 1   Wound / Incision (Open or Dehisced) 02/16/21 Cervical Mid 02/16/21  1315  Cervical  1011            Intake/Output Last 24 hours  Intake/Output Summary (Last 24 hours) at  11/24/2023 0753 Last data filed at 11/24/2023 0538 Gross per 24 hour  Intake 1000 ml  Output --  Net 1000 ml    Labs/Imaging Results for orders placed or performed during the hospital encounter of 11/24/23 (from the past 48 hours)  Hepatic function panel     Status: None   Collection Time: 11/24/23  2:23 AM  Result Value Ref Range   Total Protein 6.8 6.5 - 8.1 g/dL   Albumin 4.4 3.5 - 5.0 g/dL   AST 20 15 - 41 U/L   ALT  19 0 - 44 U/L   Alkaline Phosphatase 71 38 - 126 U/L   Total Bilirubin 0.5 0.0 - 1.2 mg/dL   Bilirubin, Direct 0.2 0.0 - 0.2 mg/dL   Indirect Bilirubin 0.3 0.3 - 0.9 mg/dL    Comment: Performed at [facility], [address] [place], [place] [address]  Lipase, blood     Status: None   Collection Time: 11/24/23  2:23 AM  Result Value Ref Range   Lipase 24 11 - 51 U/L    Comment: Performed at [facility], [address] [place], [place] [address]  Basic metabolic panel     Status: None   Collection Time: 11/24/23  2:24 AM  Result Value Ref Range   Sodium 135 135 - 145 mmol/L   Potassium 3.8 3.5 - 5.1 mmol/L   Chloride 101 98 - 111 mmol/L   CO2 22 22 - 32 mmol/L   Glucose, Bld 95 70 - 99 mg/dL    Comment: Glucose reference range applies only to samples taken after fasting for at least 8 hours.   BUN 12 6 - 20 mg/dL   Creatinine, Ser 9.36 0.44 - 1.00 mg/dL   Calcium  9.4 8.9 - 10.3 mg/dL   GFR, Estimated >39 >39 mL/min    Comment: (NOTE) Calculated using the CKD-EPI Creatinine Equation (2021)    Anion gap 13 5 - 15    Comment: Performed at [facility], [address] [place], [place] [address]  CBC     Status: Abnormal   Collection Time: 11/24/23  2:24 AM  Result Value Ref Range   WBC 9.2 4.0 - 10.5 K/uL   RBC 4.32 3.87 - 5.11 MIL/uL   Hemoglobin 7.3 (L) 12.0 - 15.0 g/dL    Comment: Reticulocyte Hemoglobin testing may be clinically indicated, consider ordering this additional test [id number]    HCT 26.9 (L) 36.0 - 46.0 %   MCV 62.3 (L) 80.0 - 100.0 fL   MCH 16.9 (L) 26.0 - 34.0 pg   MCHC 27.1 (L) 30.0 - 36.0 g/dL   RDW 81.5 (H) 88.4 - 84.4 %   Platelets 327 150 - 400 K/uL    Comment: REPEATED TO VERIFY PLATELET COUNT CONFIRMED BY SMEAR    nRBC 0.0 0.0 - 0.2 %    Comment: Performed at [facility], [address] [place], [place] [address]  Troponin T, High Sensitivity     Status: None   Collection Time: 11/24/23  2:24 AM   Result Value Ref Range   Troponin T High Sensitivity <15 <19 ng/L    Comment: (NOTE) Biotin concentrations > 1000 ng/mL falsely decrease TnT results.  Serial cardiac troponin measurements are suggested.  Refer to the Links section for chest pain algorithms and additional  guidance. Performed at [facility], [address] [place], [place] [address]   Pregnancy, urine  Status: None   Collection Time: 11/24/23  3:52 AM  Result Value Ref Range   Preg Test, Ur NEGATIVE NEGATIVE    Comment:        THE SENSITIVITY OF THIS METHODOLOGY IS >20 mIU/mL. Performed at [facility], [address] [place], [place] [address]   D-dimer, quantitative     Status: None   Collection Time: 11/24/23  3:52 AM  Result Value Ref Range   D-Dimer, Quant <0.27 0.00 - 0.50 ug/mL-FEU    Comment: (NOTE) At the manufacturer cut-off value of 0.5 g/mL FEU, this assay has a negative predictive value of 95-100%.This assay is intended for use in conjunction with a clinical pretest probability (PTP) assessment model to exclude pulmonary embolism (PE) and deep venous thrombosis (DVT) in outpatients suspected of PE or DVT. Results should be correlated with clinical presentation. Performed at [facility], [address] [place], [place] [address]   Reticulocytes     Status: Abnormal   Collection Time: 11/24/23  4:00 AM  Result Value Ref Range   Retic Ct Pct 1.8 0.4 - 3.1 %    Comment: REPEATED TO VERIFY   RBC. 4.58 3.87 - 5.11 MIL/uL    Comment: REPEATED TO VERIFY   Retic Count, Absolute 80.2 19.0 - 186.0 K/uL    Comment: REPEATED TO VERIFY   Immature Retic Fract 26.2 (H) 2.3 - 15.9 %    Comment: REPEATED TO VERIFY Performed at [facility], [address] [place], [place] [address]   Troponin T, High Sensitivity     Status: None   Collection Time: 11/24/23  4:03 AM  Result Value Ref Range   Troponin T High Sensitivity <15 <19 ng/L    Comment:  (NOTE) Biotin concentrations > 1000 ng/mL falsely decrease TnT results.  Serial cardiac troponin measurements are suggested.  Refer to the Links section for chest pain algorithms and additional  guidance. Performed at [facility], [address] [place], [place] [address]   Occult blood card to lab, stool     Status: None   Collection Time: 11/24/23  5:09 AM  Result Value Ref Range   Fecal Occult Bld NEGATIVE NEGATIVE    Comment: Performed at [facility], [address] [place], [place] [address]   DG Shoulder Right Result Date: 11/24/2023 CLINICAL DATA:  23 year old female with intermittent chest pain, shortness of breath, right upper extremity pain. EXAM: RIGHT SHOULDER - 2+ VIEW COMPARISON:  Chest radiographs 0229 hours today. Three views at 0452 hours. FINDINGS: Bone mineralization is within normal limits. There is no evidence of fracture or dislocation. There is no evidence of arthropathy or other focal bone abnormality. Negative visible right ribs and chest. IMPRESSION: Negative. Electronically Signed   By: VEAR Hurst M.D.   On: 11/24/2023 06:24   DG Chest 2 View Result Date: 11/24/2023 CLINICAL DATA:  Intermittent chest tightness with shortness of breath. EXAM: CHEST - 2 VIEW COMPARISON:  May 11, 2023 FINDINGS: The heart size and mediastinal contours are within normal limits. Both lungs are clear. The visualized skeletal structures are unremarkable. IMPRESSION: No active cardiopulmonary disease. Electronically Signed   By: Suzen Dials M.D.   On: 11/24/2023 02:38    Pending Labs Unresulted Labs (From admission, onward)     Start     Ordered   11/24/23 0322  Vitamin B12  (Anemia Panel (PNL))  Once,   URGENT  11/24/23 0321   11/24/23 0322  Folate  (Anemia Panel (PNL))  Once,   URGENT        11/24/23 0321   11/24/23 0322  Iron and TIBC  (Anemia Panel (PNL))  Once,   URGENT        11/24/23 0321   11/24/23 0322  Ferritin  (Anemia Panel (PNL))   Once,   URGENT        11/24/23 0321            Vitals/Pain Today's Vitals   11/24/23 0631 11/24/23 0632 11/24/23 0732 11/24/23 0748  BP:    112/64  Pulse:    94  Resp:    (!) 22  Temp:  98.1 F (36.7 C)    TempSrc:  Oral    SpO2:    100%  Weight:      Height:      PainSc: 7   6      Isolation Precautions No active isolations  Medications Medications  sodium chloride  0.9 % bolus 1,000 mL (0 mLs Intravenous Stopped 11/24/23 0538)  ondansetron  (ZOFRAN ) injection 4 mg (4 mg Intravenous Given 11/24/23 0405)  morphine  (PF) 4 MG/ML injection 4 mg (4 mg Intravenous Given 11/24/23 0417)  fentaNYL  (SUBLIMAZE ) injection 50 mcg (50 mcg Intravenous Given 11/24/23 0548)    Mobility walks     Focused Assessments    R Recommendations: See Admitting Provider Note  Report given to: Delon, RN  Additional Notes:

## 2023-11-24 NOTE — ED Notes (Signed)
 Report given to Kristin at [facility]

## 2023-11-24 NOTE — ED Notes (Signed)
Attempted to call report but no answer.

## 2023-11-24 NOTE — Progress Notes (Signed)
 Pt's pharmacy for discharge medication changed to [facility] on [address]. Pharmacy list updated in chart. Patient has asked to speak with the doctor before she leaves per primary nurse and patient

## 2023-11-24 NOTE — Plan of Care (Signed)
 Plan of Care Note for accepted transfer   Patient name: Beth Key FMW:[id number] DOB: September 11, 2000  Facility requesting transfer: [facility] ED Requesting Provider: Dr. Carita Facility course: 23 year old female with history of asthma, ADHD, anxiety, GERD presented to the ED with complaints of chest pain, right shoulder pain, and vomiting after taking Adderall for the first time tonight. Tachycardic to the 110s on arrival but remainder of vital signs stable.  Labs notable for hemoglobin 7.3 (previously 9.1 a month ago), FOBT negative, lipase and LFTs normal, troponin negative x 2, D-dimer normal.  Chest x-ray showing no active cardiopulmonary disease. X-ray of right shoulder negative.  Patient was given fentanyl , morphine , Zofran , and 1 L IV fluids.   Plan of care: The patient is accepted for admission to Telemetry unit at [facility].  [facility] will assume care on arrival to accepting facility. Until arrival, care as per EDP. However, [facility] available 24/7 for questions and assistance.  Check [email or web address] for on-call coverage.  Nursing staff, please call [facility] Admits & Consults System-Wide number under Amion on patient's arrival so appropriate admitting provider can evaluate the pt.

## 2023-11-24 NOTE — ED Notes (Signed)
 Pt to xray

## 2023-11-24 NOTE — Progress Notes (Signed)
 PT Cancellation Note  Patient Details Name: Beth Key MRN: [id number] DOB: Feb 26, 2001   Cancelled Treatment:    Reason Eval/Treat Not Completed: PT screened, no needs identified, will sign off  Pt amb 500' with nursing staff, symptomatic of decr Hgb however no PT needs at this time    [facility] 11/24/2023, 3:20 PM

## 2023-11-24 NOTE — ED Provider Notes (Signed)
 [facility] Provider Note   CSN: [id number] Arrival date & time: 11/24/23  0209     Patient presents with: Chest Pain   Beth Key is a 23 y.o. female.   History of asthma, ADHD, anxiety, GERD presents with right shoulder pain and chest pain.  Reports symptoms started with severe right shoulder pain about 6 PM while she was at work.  Denies any injury.  Pain to her right shoulder is constant radiates to her chest.  Chest pain comes and goes lasting for several seconds to minutes at a time.  Associate with several episodes of nausea and vomiting.  All symptoms started after she took Adderall for the first time tonight about an hour prior to her symptoms starting.  Chest pain is worse with movement and is worse with exertion.  She has vomited several times since taking Adderall.  Shoulder pain and chest tightness started after about 6 PM.  Denies any cardiac history.  Denies any history of blood clots.  Denies any abdominal pain, fever, pain with urination or blood in the urine.  The history is provided by the patient.  Chest Pain Associated symptoms: nausea, shortness of breath and vomiting   Associated symptoms: no abdominal pain, no dizziness, no fever, no headache and no weakness        Prior to Admission medications   Medication Sig Start Date End Date Taking? Authorizing Provider  cyclobenzaprine  (FLEXERIL ) 10 MG tablet Take 1 tablet (10 mg total) by mouth 2 (two) times daily as needed for muscle spasms. 03/15/23   Victor Lynwood DASEN, PA-C  lidocaine  (LIDODERM ) 5 % Place 1 patch onto the skin daily. Remove & Discard patch within 12 hours or as directed by MD 03/15/23   Victor Lynwood DASEN, PA-C  ondansetron  (ZOFRAN -ODT) 4 MG disintegrating tablet Take 1 tablet (4 mg total) by mouth every 8 (eight) hours as needed for nausea or vomiting. 05/11/23   Dean Clarity, MD  venlafaxine  XR (EFFEXOR -XR) 37.5 MG 24 hr capsule Take 37.5 mg by mouth  daily.    [provider]    Allergies: Penicillins, Promethazine , and Reglan  [metoclopramide ]    Review of Systems  Constitutional:  Negative for activity change, appetite change, chills and fever.  HENT:  Negative for congestion.   Respiratory:  Positive for chest tightness and shortness of breath.   Cardiovascular:  Positive for chest pain.  Gastrointestinal:  Positive for nausea and vomiting. Negative for abdominal pain.  Genitourinary:  Negative for dysuria and hematuria.  Musculoskeletal:  Negative for arthralgias and myalgias.  Skin:  Negative for rash.  Neurological:  Negative for dizziness, weakness and headaches.   all other systems are negative except as noted in the HPI and PMH.    Updated Vital Signs BP 129/85 (BP Location: Right Arm)   Pulse (!) 111   Temp 98 F (36.7 C) (Oral)   Resp 17   Ht 5' 7 (1.702 m)   Wt 86.6 kg   LMP 10/25/2023 (Approximate)   SpO2 100%   Breastfeeding Unknown   BMI 29.91 kg/m   Physical Exam Vitals and nursing note reviewed.  Constitutional:      General: She is not in acute distress.    Appearance: She is well-developed.     Comments: uncomfortable, holding right shoulder  HENT:     Head: Normocephalic and atraumatic.     Mouth/Throat:     Pharynx: No oropharyngeal exudate.  Eyes:  Conjunctiva/sclera: Conjunctivae normal.     Pupils: Pupils are equal, round, and reactive to light.  Neck:     Comments: No meningismus. Cardiovascular:     Rate and Rhythm: Regular rhythm. Tachycardia present.     Heart sounds: Normal heart sounds. No murmur heard. Pulmonary:     Effort: Pulmonary effort is normal. No respiratory distress.     Breath sounds: Normal breath sounds.  Abdominal:     Palpations: Abdomen is soft.     Tenderness: There is no abdominal tenderness. There is no guarding or rebound.  Genitourinary:    Comments: Chaperone present Academic librarian.  No hemorrhoids or fissures.  No gross blood Musculoskeletal:         General: Tenderness present.     Cervical back: Normal range of motion and neck supple.     Comments: Painful right shoulder, pain is not reproducible.  Not worse with movement.  Intact radial pulse  Skin:    General: Skin is warm.  Neurological:     Mental Status: She is alert and oriented to person, place, and time.     Cranial Nerves: No cranial nerve deficit.     Motor: No abnormal muscle tone.     Coordination: Coordination normal.     Comments:  5/5 strength throughout. CN 2-12 intact.Equal grip strength.   Psychiatric:        Behavior: Behavior normal.     (all labs ordered are listed, but only abnormal results are displayed) Labs Reviewed  CBC - Abnormal; Notable for the following components:      Result Value   Hemoglobin 7.3 (*)    HCT 26.9 (*)    MCV 62.3 (*)    MCH 16.9 (*)    MCHC 27.1 (*)    RDW 18.4 (*)    All other components within normal limits  RETICULOCYTES - Abnormal; Notable for the following components:   Immature Retic Fract 26.2 (*)    All other components within normal limits  BASIC METABOLIC PANEL WITH GFR  PREGNANCY, URINE  HEPATIC FUNCTION PANEL  LIPASE, BLOOD  D-DIMER, QUANTITATIVE (NOT AT [facility])  OCCULT BLOOD X 1 CARD TO LAB, STOOL  VITAMIN B12  FOLATE  IRON AND TIBC  FERRITIN  POC OCCULT BLOOD, ED  TROPONIN T, HIGH SENSITIVITY  TROPONIN T, HIGH SENSITIVITY    EKG: None  Radiology: DG Chest 2 View Result Date: 11/24/2023 CLINICAL DATA:  Intermittent chest tightness with shortness of breath. EXAM: CHEST - 2 VIEW COMPARISON:  May 11, 2023 FINDINGS: The heart size and mediastinal contours are within normal limits. Both lungs are clear. The visualized skeletal structures are unremarkable. IMPRESSION: No active cardiopulmonary disease. Electronically Signed   By: Suzen Dials M.D.   On: 11/24/2023 02:38     Procedures   Medications Ordered in the ED  sodium chloride  0.9 % bolus 1,000 mL (has no administration in time  range)  ondansetron  (ZOFRAN ) injection 4 mg (has no administration in time range)  morphine  (PF) 4 MG/ML injection 4 mg (has no administration in time range)                                    Medical Decision Making Amount and/or Complexity of Data Reviewed Labs: ordered. Decision-making details documented in ED Course. Radiology: ordered and independent interpretation performed. Decision-making details documented in ED Course. ECG/medicine tests: ordered and independent interpretation performed. Decision-making  details documented in ED Course.  Risk Prescription drug management. Decision regarding hospitalization.   Right shoulder pain and chest pain that onset acutely about 6 PM associate with nausea and vomiting.  Tachycardic on arrival.  EKG is sinus rhythm without acute ST changes.  Low suspicion for ACS.  Troponin is negative.  D-dimer is negative.  However chest pain is not reproducible and shoulder pain is not reproducible. Does have hemoglobin of 7.3.  Her baseline appears to be at 9-10.  Denies any recent black or bloody stools.  Denies any heavy vaginal bleeding. Does admit to eating a lot of ice  Patient given morphine  as well as Zofran .  Heart rate improved into the 80s and 90s.  Right shoulder pain seems atypical for ACS.  Troponin negative.  Query whether she is having symptomatic anemia causing her chest pain, pressure and shortness of breath. D-dimer negative.  Troponin negative.  X-ray negative.  Pain is improved with morphine .  Troponin remains negative with low concern for ACS.  Question whether symptomatic anemia is contributing to her chest pain and shortness of breath and shoulder pain.  Heart rate has normalized.  Type and screen not ordered as no blood products available at this facility.  Anemia labs sent.  She does admit to ice consumption.  Admission discussed with Dr. Alfornia.  ED ECG REPORT   Date: 11/24/2023  Rate: 113  Rhythm: sinus tachycardia   QRS Axis: normal  Intervals: normal  ST/T Wave abnormalities: normal  Conduction Disutrbances:none  Narrative Interpretation:   Old EKG Reviewed: none available  I have personally reviewed the EKG tracing and agree with the computerized printout as noted.      Final diagnoses:  Symptomatic anemia  Atypical chest pain    ED Discharge Orders     None          Shaneil Yazdi, Garnette, MD 11/24/23 [phone]

## 2023-11-24 NOTE — Plan of Care (Signed)
 Prior to discharge, patient asked to speak with me once again.  She ambulated 500 feet in the hallway prior to her phone call, says that she had just a little bit of lightheadedness when she first stood up.  States that she has been having significant lethargy and tiredness every day for the last several weeks.  Given the symptoms, we discussed the risk and benefit of blood transfusion versus simply doing oral iron replacement.  After informed decision-making, patient states she would be amenable to blood transfusion prior to discharge. Discharge canceled, orders placed for 1 unit PRBC transfusion.

## 2023-11-25 ENCOUNTER — Observation Stay (HOSPITAL_COMMUNITY)

## 2023-11-25 DIAGNOSIS — D509 Iron deficiency anemia, unspecified: Secondary | ICD-10-CM | POA: Insufficient documentation

## 2023-11-25 DIAGNOSIS — D649 Anemia, unspecified: Secondary | ICD-10-CM | POA: Diagnosis not present

## 2023-11-25 DIAGNOSIS — E538 Deficiency of other specified B group vitamins: Secondary | ICD-10-CM | POA: Insufficient documentation

## 2023-11-25 LAB — HEMOGLOBIN AND HEMATOCRIT, BLOOD
HCT: 29.2 % — ABNORMAL LOW (ref 36.0–46.0)
Hemoglobin: 8 g/dL — ABNORMAL LOW (ref 12.0–15.0)

## 2023-11-25 MED ORDER — FOLIC ACID 1 MG PO TABS: 1.0000 mg | ORAL_TABLET | Freq: Every day | ORAL | 0 refills | Status: AC

## 2023-11-25 MED ORDER — GADOBUTROL 1 MMOL/ML IV SOLN
9.0000 mL | Freq: Once | INTRAVENOUS | Status: AC | PRN
  Administered 2023-11-25: 9 mL via INTRAVENOUS

## 2023-11-25 NOTE — TOC Initial Note (Signed)
 Transition of Care [facility]) - Initial/Assessment Note    Patient Details  Name: Beth Key MRN: [id number] Date of Birth: March 11, 2001  Transition of Care [facility]) CM/SW Contact:    Beth Manuella Quill, RN Phone Number: 11/25/2023, 12:17 PM  Clinical Narrative:                 Beth Key w/ pt in room; pt says she lives at home; she plans to return at d/c; pt identified POC father Beth Key [phone]); family will provide transportation; pt verified insurance/PCP; she denied SDOH risks; pt says she does not have DME, HH services, or home oxygen; no TOC needs.  Expected Discharge Plan: Home/Self Care Barriers to Discharge: No Barriers Identified   Patient Goals and CMS Choice Patient states their goals for this hospitalization and ongoing recovery are:: home          Expected Discharge Plan and Services   Discharge Planning Services: CM Consult   Living arrangements for the past 2 months: Single Family Home Expected Discharge Date: 11/25/23               DME Arranged: N/A DME Agency: NA       HH Arranged: NA HH Agency: NA        Prior Living Arrangements/Services Living arrangements for the past 2 months: Single Family Home Lives with:: Minor Children Patient language and need for interpreter reviewed:: Yes Do you feel safe going back to the place where you live?: Yes      Need for Family Participation in Patient Care: Yes (Comment) Care giver support system in place?: Yes (comment) Current home services:  (n/a) Criminal Activity/Legal Involvement Pertinent to Current Situation/Hospitalization: No - Comment as needed  Activities of Daily Living   ADL Screening (condition at time of admission) Independently performs ADLs?: Yes (appropriate for developmental age) Is the patient deaf or have difficulty hearing?: No Does the patient have difficulty seeing, even when wearing glasses/contacts?: No Does the patient have difficulty concentrating, remembering, or  making decisions?: No  Permission Sought/Granted Permission sought to share information with : Case Manager Permission granted to share information with : Yes, Verbal Permission Granted  Share Information with NAME: Case Manager     Permission granted to share info w Relationship: Beth Key (father) [phone]     Emotional Assessment Appearance:: Appears stated age Attitude/Demeanor/Rapport: Gracious Affect (typically observed): Accepting Orientation: : Oriented to Self, Oriented to Place, Oriented to  Time, Oriented to Situation Alcohol / Substance Use: Not Applicable Psych Involvement: No (comment)  Admission diagnosis:  Atypical chest pain [R07.89] Symptomatic anemia [D64.9] Patient Active Problem List   Diagnosis Date Noted   Iron deficiency anemia 11/25/2023   Folate deficiency 11/25/2023   Symptomatic anemia 11/24/2023   Conflict between patient and family 07/26/2023   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   Drug use affecting pregnancy 06/02/2017   Gastroesophageal reflux disease without esophagitis 10/30/2015   Panic disorder 07/15/2015   Substance abuse (HCC) 09/18/2014   Nicotine abuse 10/15/2013   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 10/15/2013   Disordered eating 10/15/2013   Menorrhagia with irregular cycle 10/08/2013   PCP:  Beth Rosina CROME, PA-C Pharmacy:   [id number] [place], [place] - [address] AT [facility] [address] [place] [address] Phone: [phone] Fax: [phone]  [id number] - [place], [place] - [address] [place] [address] Phone: [phone] Fax: [phone]  [facility] [place] [facility] [address] [place] [address] Phone: [phone] Fax: [phone]     Social Drivers of Health (SDOH) Social History: SDOH Screenings    Food Insecurity: No Food Insecurity (11/25/2023)  Housing: Low Risk  (11/25/2023)  Transportation Needs: No Transportation Needs (11/25/2023)  Utilities: Not At Risk (11/25/2023)  Social Connections: Unknown (09/20/2021)   Received from [facility]  Tobacco Use: High Risk (11/24/2023)   SDOH Interventions: Food Insecurity Interventions: Intervention Not Indicated, Inpatient TOC Housing Interventions: Intervention Not Indicated, Inpatient TOC Transportation Interventions: Intervention Not Indicated, Inpatient TOC Utilities Interventions: Intervention Not Indicated, Inpatient TOC   Readmission Risk Interventions     No data to display

## 2023-11-25 NOTE — Plan of Care (Signed)

## 2023-11-25 NOTE — Discharge Summary (Signed)
 Physician Discharge Summary  Beth Key FMW:[id number] DOB: May 23, 2001 DOA: 11/24/2023  PCP: Nena Rosina CROME, PA-C  Admit date: 11/24/2023 Discharge date: 11/25/2023    Admitted From: Home Disposition: Home  Recommendations for Outpatient Follow-up:  Follow up with PCP in 1-2 weeks Please obtain BMP/CBC in one week Please follow up with your PCP on the following pending results: Unresulted Labs (From admission, onward)    None         Home Health: None Equipment/Devices: None  Discharge Condition: Stable CODE STATUS: Full code Diet recommendation: Regular  Subjective: Seen and examined, other than right shoulder pain, no other complaint.  Denies any chest pain or palpitation or shortness of breath.  Brief/Interim Summary:  Beth Key is a 23 y.o. female with medical history significant for ADHD, anxiety, GERD and chronic anemia who was admitted to the hospitalist service due to tachycardia and chest pain.  Per patient, she was prescribed Adderall a few days ago by her PCP.  She took the first dose yesterday afternoon, and went to work.  About an hour after taking this medication, she had sudden onset of palpitations, diaphoresis, anxiety, chest discomfort as well as right shoulder discomfort.  The symptoms lasted a total of about 12 hours, and have finally subsided this morning.  States that she is tired all the time, and does eat ice often.  She was complaining of right shoulder pain which started afternoon of 11/24/2023 suddenly without any history of trauma or exertion.  She denied any dark or tarry stools, any new abdominal pain, nausea or vomiting.  She denies any abnormal uterine bleeding.  States that her menses is quite irregular, but when it happens it is quite short without heavy bleeding.  She denies any recent weight loss, nausea or vomiting.  She was admitted under hospital service.  Hemoglobin was 7.3.  Patient initially did not want to stay in the hospital and  wanted to go home but after counseling, she opted to stay in the hospital and receive blood transfusion.  She was given 1 PRBC transfusion.  Hemoglobin is 8.0 today.  Workup showed that she has severe iron deficiency anemia as well as folate deficiency anemia.  FOBT is negative so GI bleed appears to be less likely.  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  Will need to seek advice from gynecologist as outpatient.  She is stable and in agreement with going home today.  Discharging on oral supplement of folate and iron pills.  Advised to take it easy with the right shoulder, no heavy lifting or exertion.  Take ibuprofen  or Tylenol  alternating and cold packs.  Follow-up with PCP in 1 week.  Discharge plan was discussed with patient and/or family member and they verbalized understanding and agreed with it.  Discharge Diagnoses:  Principal Problem:   Symptomatic anemia Active Problems:   Iron deficiency anemia   Folate deficiency    Discharge Instructions   Allergies as of 11/25/2023       Reactions   Adderall [amphetamine-dextroamphetamine] Palpitations   Penicillins Rash, Other (See Comments)   Promethazine  Other (See Comments)   Dystonic reaction to IV Promethazine  (jerky movements)   Reglan  [metoclopramide ] Other (See Comments)   Made her jittery        Medication List     STOP taking these medications    amphetamine-dextroamphetamine 10 MG tablet Commonly known as: ADDERALL       TAKE these medications    acetaminophen  500 MG tablet Commonly known as: TYLENOL  Take  500-1,000 mg by mouth every 6 (six) hours as needed for moderate pain (pain score 4-6).   ferrous sulfate  325 (65 FE) MG EC tablet Take 1 tablet (325 mg total) by mouth 2 (two) times daily.   folic acid  1 MG tablet Commonly known as: FOLVITE  Take 1 tablet (1 mg total) by mouth daily.        Follow-up Information     Nena Rosina CROME, PA-C Follow up in 1 week(s).   Specialty: Physician Assistant Contact  information: [phone] [place] [address] [phone]                Allergies  Allergen Reactions   Adderall [Amphetamine-Dextroamphetamine] Palpitations   Penicillins Rash and Other (See Comments)   Promethazine  Other (See Comments)    Dystonic reaction to IV Promethazine  (jerky movements)   Reglan  [Metoclopramide ] Other (See Comments)    Made her jittery    Consultations: None   Procedures/Studies: DG Shoulder Right Result Date: 11/24/2023 CLINICAL DATA:  23 year old female with intermittent chest pain, shortness of breath, right upper extremity pain. EXAM: RIGHT SHOULDER - 2+ VIEW COMPARISON:  Chest radiographs 0229 hours today. Three views at 0452 hours. FINDINGS: Bone mineralization is within normal limits. There is no evidence of fracture or dislocation. There is no evidence of arthropathy or other focal bone abnormality. Negative visible right ribs and chest. IMPRESSION: Negative. Electronically Signed   By: VEAR Hurst M.D.   On: 11/24/2023 06:24   DG Chest 2 View Result Date: 11/24/2023 CLINICAL DATA:  Intermittent chest tightness with shortness of breath. EXAM: CHEST - 2 VIEW COMPARISON:  May 11, 2023 FINDINGS: The heart size and mediastinal contours are within normal limits. Both lungs are clear. The visualized skeletal structures are unremarkable. IMPRESSION: No active cardiopulmonary disease. Electronically Signed   By: Suzen Dials M.D.   On: 11/24/2023 02:38     Discharge Exam: Vitals:   11/24/23 2238 11/25/23 0512  BP: 129/87 114/72  Pulse: 100 72  Resp: 18 18  Temp: 97.7 F (36.5 C) 98.4 F (36.9 C)  SpO2: 100% 99%   Vitals:   11/24/23 1845 11/24/23 2053 11/24/23 2238 11/25/23 0512  BP:  123/88 129/87 114/72  Pulse:  98 100 72  Resp: 15 16 18 18   Temp:  99.1 F (37.3 C) 97.7 F (36.5 C) 98.4 F (36.9 C)  TempSrc:  Oral Oral Oral  SpO2:  100% 100% 99%  Weight:      Height:        General: Pt is alert, awake, not in acute  distress Cardiovascular: RRR, S1/S2 +, no rubs, no gallops Respiratory: CTA bilaterally, no wheezing, no rhonchi Abdominal: Soft, NT, ND, bowel sounds + Extremities: no edema, no cyanosis    The results of significant diagnostics from this hospitalization (including imaging, microbiology, ancillary and laboratory) are listed below for reference.     Microbiology: No results found for this or any previous visit (from the past 240 hours).   Labs: BNP (last 3 results) No results for input(s): BNP in the last 8760 hours. Basic Metabolic Panel: Recent Labs  Lab 11/24/23 0224  NA 135  K 3.8  CL 101  CO2 22  GLUCOSE 95  BUN 12  CREATININE 0.63  CALCIUM  9.4   Liver Function Tests: Recent Labs  Lab 11/24/23 0223  AST 20  ALT 19  ALKPHOS 71  BILITOT 0.5  PROT 6.8  ALBUMIN 4.4   Recent Labs  Lab 11/24/23 0223  LIPASE 24   No results for input(s): AMMONIA in the last 168 hours. CBC: Recent Labs  Lab 11/24/23 0224 11/25/23 0527  WBC 9.2  --   HGB 7.3* 8.0*  HCT 26.9* 29.2*  MCV 62.3*  --   PLT 327  --    Cardiac Enzymes: No results for input(s): CKTOTAL, CKMB, CKMBINDEX, TROPONINI in the last 168 hours. BNP: Invalid input(s): POCBNP CBG: No results for input(s): GLUCAP in the last 168 hours. D-Dimer Recent Labs    11/24/23 0352  DDIMER <0.27   Hgb A1c No results for input(s): HGBA1C in the last 72 hours. Lipid Profile No results for input(s): CHOL, HDL, LDLCALC, TRIG, CHOLHDL, LDLDIRECT in the last 72 hours. Thyroid function studies No results for input(s): TSH, T4TOTAL, T3FREE, THYROIDAB in the last 72 hours.  Invalid input(s): FREET3 Anemia work up Recent Labs    11/24/23 0352 11/24/23 0354 11/24/23 0400  VITAMINB12 281  --   --   FOLATE  --  19.1  --   FERRITIN 3*  --   --   TIBC 617*  --   --   IRON 33  --   --   RETICCTPCT  --   --  1.8   Urinalysis    Component Value Date/Time   COLORURINE  YELLOW 05/11/2023 1155   APPEARANCEUR HAZY (A) 05/11/2023 1155   LABSPEC 1.030 05/11/2023 1155   PHURINE 6.0 05/11/2023 1155   GLUCOSEU NEGATIVE 05/11/2023 1155   HGBUR NEGATIVE 05/11/2023 1155   BILIRUBINUR NEGATIVE 05/11/2023 1155   BILIRUBINUR n 03/10/2015 1519   KETONESUR >80 (A) 05/11/2023 1155   PROTEINUR 30 (A) 05/11/2023 1155   UROBILINOGEN negative 03/10/2015 1519   UROBILINOGEN 1.0 02/04/2015 1215   NITRITE NEGATIVE 05/11/2023 1155   LEUKOCYTESUR SMALL (A) 05/11/2023 1155   Sepsis Labs Recent Labs  Lab 11/24/23 0224  WBC 9.2   Microbiology No results found for this or any previous visit (from the past 240 hours).  FURTHER DISCHARGE INSTRUCTIONS:   Get Medicines reviewed and adjusted: Please take all your medications with you for your next visit with your Primary MD   Laboratory/radiological data: Please request your Primary MD to go over all hospital tests and procedure/radiological results at the follow up, please ask your Primary MD to get all Hospital records sent to his/her office.   In some cases, they will be blood work, cultures and biopsy results pending at the time of your discharge. Please request that your primary care M.D. goes through all the records of your hospital data and follows up on these results.   Also Note the following: If you experience worsening of your admission symptoms, develop shortness of breath, life threatening emergency, suicidal or homicidal thoughts you must seek medical attention immediately by calling 911 or calling your MD immediately  if symptoms less severe.   You must read complete instructions/literature along with all the possible adverse reactions/side effects for all the Medicines you take and that have been prescribed to you. Take any new Medicines after you have completely understood and accpet all the possible adverse reactions/side effects.    patient was instructed, not to drive, operate heavy machinery, perform  activities at heights, swimming or participation in water activities or provide baby-sitting services while on Pain, Sleep and Anxiety Medications; until their outpatient Physician has advised to do so again. Also recommended to not to take more than prescribed Pain, Sleep and Anxiety Medications.  It is not advisable to combine anxiety, sleep  and pain medications without talking with your primary care provider.     Wear Seat belts while driving.   Please note: You were cared for by a hospitalist during your hospital stay. Once you are discharged, your primary care physician will handle any further medical issues. Please note that NO REFILLS for any discharge medications will be authorized once you are discharged, as it is imperative that you return to your primary care physician (or establish a relationship with a primary care physician if you do not have one) for your post hospital discharge needs so that they can reassess your need for medications and monitor your lab values  Time coordinating discharge: Over 30 minutes  SIGNED:   Fredia Skeeter, MD  [facility] 11/25/2023, 10:04 AM *Please note that this is a verbal dictation therefore any spelling or grammatical errors are due to the Dragon [employer] One system interpretation. If 7PM-7AM, please contact night-coverage [email or web address]

## 2023-11-27 LAB — TYPE AND SCREEN
ABO/RH(D): A POS
Antibody Screen: NEGATIVE
Unit division: 0

## 2023-11-27 LAB — BPAM RBC
Blood Product Expiration Date: 202508052359
ISSUE DATE / TIME: 202507041817
Unit Type and Rh: 6200
